# Patient Record
Sex: Female | Born: 1955 | Race: White | Hispanic: No | Marital: Married | Smoking: Former smoker
Health system: Southern US, Community
[De-identification: ages and names within clinical notes are randomized; demographics above are authoritative.]

## PROBLEM LIST (undated history)

## (undated) DIAGNOSIS — G8929 Other chronic pain: Secondary | ICD-10-CM

## (undated) DIAGNOSIS — K219 Gastro-esophageal reflux disease without esophagitis: Secondary | ICD-10-CM

## (undated) DIAGNOSIS — I10 Essential (primary) hypertension: Secondary | ICD-10-CM

## (undated) DIAGNOSIS — F32A Depression, unspecified: Secondary | ICD-10-CM

## (undated) DIAGNOSIS — Z973 Presence of spectacles and contact lenses: Secondary | ICD-10-CM

## (undated) DIAGNOSIS — R32 Unspecified urinary incontinence: Secondary | ICD-10-CM

## (undated) DIAGNOSIS — F411 Generalized anxiety disorder: Secondary | ICD-10-CM

## (undated) DIAGNOSIS — K52831 Collagenous colitis: Secondary | ICD-10-CM

## (undated) DIAGNOSIS — G894 Chronic pain syndrome: Secondary | ICD-10-CM

## (undated) DIAGNOSIS — K580 Irritable bowel syndrome with diarrhea: Secondary | ICD-10-CM

## (undated) DIAGNOSIS — K859 Acute pancreatitis without necrosis or infection, unspecified: Secondary | ICD-10-CM

## (undated) DIAGNOSIS — M545 Low back pain, unspecified: Secondary | ICD-10-CM

## (undated) DIAGNOSIS — G47419 Narcolepsy without cataplexy: Secondary | ICD-10-CM

## (undated) DIAGNOSIS — F4001 Agoraphobia with panic disorder: Secondary | ICD-10-CM

## (undated) DIAGNOSIS — G4730 Sleep apnea, unspecified: Secondary | ICD-10-CM

## (undated) DIAGNOSIS — F3181 Bipolar II disorder: Secondary | ICD-10-CM

## (undated) DIAGNOSIS — M5430 Sciatica, unspecified side: Secondary | ICD-10-CM

## (undated) DIAGNOSIS — F329 Major depressive disorder, single episode, unspecified: Secondary | ICD-10-CM

## (undated) DIAGNOSIS — F5105 Insomnia due to other mental disorder: Secondary | ICD-10-CM

## (undated) DIAGNOSIS — E039 Hypothyroidism, unspecified: Secondary | ICD-10-CM

## (undated) HISTORY — PX: TUBAL LIGATION: SHX77

## (undated) HISTORY — PX: BREAST LUMPECTOMY: SHX2

## (undated) HISTORY — PX: DILATION AND CURETTAGE OF UTERUS: SHX78

---
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
## 1983-12-26 HISTORY — PX: ABDOMINAL HYSTERECTOMY: SHX81

## 1987-12-26 HISTORY — PX: CARPAL TUNNEL RELEASE: SHX101

## 1994-12-25 HISTORY — PX: LAPAROSCOPIC SALPINGOOPHERECTOMY: SUR795

## 1995-12-26 HISTORY — PX: ABDOMINOPLASTY: SUR9

## 1999-12-26 HISTORY — PX: BREAST LUMPECTOMY: SHX2

## 2003-12-26 HISTORY — PX: MASTECTOMY: SHX3

## 2005-12-25 HISTORY — PX: LAPAROSCOPIC CHOLECYSTECTOMY: SUR755

## 2006-06-15 ENCOUNTER — Emergency Department (HOSPITAL_COMMUNITY): Admission: EM | Admit: 2006-06-15 | Discharge: 2006-06-16 | Payer: Self-pay | Admitting: Emergency Medicine

## 2007-12-26 HISTORY — PX: KNEE ARTHROSCOPY: SHX127

## 2008-11-22 ENCOUNTER — Inpatient Hospital Stay (HOSPITAL_COMMUNITY): Admission: AD | Admit: 2008-11-22 | Discharge: 2008-11-24 | Payer: Self-pay | Admitting: Psychiatry

## 2008-11-22 ENCOUNTER — Ambulatory Visit: Payer: Self-pay | Admitting: Psychiatry

## 2008-11-22 ENCOUNTER — Emergency Department (HOSPITAL_COMMUNITY): Admission: EM | Admit: 2008-11-22 | Discharge: 2008-11-22 | Payer: Self-pay | Admitting: Emergency Medicine

## 2009-12-25 HISTORY — PX: BLADDER SUSPENSION: SHX72

## 2009-12-25 HISTORY — PX: VAGINAL PROLAPSE REPAIR: SHX830

## 2010-07-25 HISTORY — PX: VAGINAL PROLAPSE REPAIR: SHX830

## 2010-12-25 HISTORY — PX: ANTERIOR CERVICAL DECOMP/DISCECTOMY FUSION: SHX1161

## 2011-05-09 NOTE — H&P (Signed)
NAME:  Annette Stewart, Annette Stewart            ACCOUNT NO.:  [id number]   MEDICAL RECORD NO.:  [id number]          PATIENT TYPE:  IPS   LOCATION:  0502                          FACILITY:  [facility]   PHYSICIAN:  Annette Stewart, M.D.      DATE OF BIRTH:  [birth year]   DATE OF ADMISSION:  11/22/2008  DATE OF DISCHARGE:                       PSYCHIATRIC ADMISSION ASSESSMENT   IDENTIFICATION:  This is a 55 year old Caucasian female who is married.  This is a voluntary admission   HISTORY OF PRESENT ILLNESS:  First [facility] admission and second psychiatric  admission for this 55 year old who reports that she has had significant  depression recently and attributes this to reason multiple losses and  chronic conflict with her stepdaughter who is living in the home.  She  had previously been taking Wellbutrin for depression, but stopped this  around April or 05-09-23 of this past year.  At that time, she was taking  Norco for abdominal pain due to adhesions, and the Norco made her feel  better.  Most recently she discovered that her abdominal pain was  relieved after surgery but she kept taking the Norco because of the way  it made her feel.  She is here requesting help with depression and  opiate abuse.  Most recently she has taken 14 5 mg tabs of Norco in one  day and took 7 tablets yesterday previous to that she took approximately  100 tablets of 10 mg in 5 days a couple of weeks ago.  She has already  spoken with her prescribing physician who will no longer prescribe this  for her.  She reports that she has been feeling very overwhelmed in the  past couple weeks due to conflict with her stepdaughter who is in the  home.  Additionally, her mother died in 05/08/2004.  Her father died in 09-May-2007  and she had cared for him in the home.  The start of her depression  began on or about 27 when her 16 year old son died after a failed  liver transplant.  She reports that her appetite has been fair.  Her  sleep has been good as usual  as long as she takes her Ativan at 2 mg  q.h.s.  She denies suicidal thought.  She is anxious to pursue treatment  for the depression and is asking for help and to be detoxed off the  opiates.   PAST PSYCHIATRIC HISTORY:  She has been seeing Annette Stewart for counseling  which she started in the past couple of weeks.  This is her second  inpatient psychiatric admission.  She has a history of one prior  admission to [facility] on or about 05/08/1989 after her son  died.  She has had previous trials of Effexor that she reports made her  feel bad.  Cymbalta made her feel bad and gave her suicidal thoughts and  Wellbutrin had helped her the best and she was taking apparently 300 mg  XL q.a.m. starting in December 2008 around the time her father died and  she took it for about 4 months.  She has also had a previous trial  of  Zoloft which she discontinued because of sexual side effects.   SOCIAL HISTORY:  She currently lives at home with her husband who is  supportive.  She reports the marriage is good.  The stepdaughter who is  age 3 has lived in their house since 2004 along with her 59-year-old  grandson.  [name] reports her husband is a very supportive of her and  is making arrangements to her daughter to move out of the house.  She  has two other adult children by previous marriage that are grown and  live on their own.  She has previously married twice and widowed from  her second husband who died after the divorce.  She had a 64 year old  son who died as previously noted, in 52.  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.   FAMILY HISTORY:  Father with history of alcohol abuse.  Brother with a  gambling addiction.   MEDICAL HISTORY:  Primary care practitioner is an [place].   CURRENT MEDICAL PROBLEMS:  None.   PAST MEDICAL HISTORY:  1. Remarkable for abdominal adhesions with previous surgery for lysis      of adhesions.  2. History of mastectomy with a  total breast reconstruction.  3. Hysterectomy in 1985.  4. Some problem with frequent headaches.   CURRENT MEDICATIONS:  1. Ativan 2 mg p.o. q.h.s.  2. Aspirin 81 mg daily.  3. Lunesta 3 mg p.o. q.h.s.  4. Estratest DS one tablet daily.  5. Vicodin or Lortab as previously noted.   ALLERGIES:  PENICILLIN.   PHYSICAL EXAMINATION:  Done in the emergency room as noted in the  record.  This is a medium build Caucasian female in no distress, 5 feet  2 inches tall, 54.1 kg, temperature 97.6, pulse 78, respirations 20,  blood pressure 142/86.   DIAGNOSTIC STUDIES:  Done in the emergency room.  Urine drug screen  positive for benzodiazepines and opiates.  CBC within normal limits.  Chemistry within normal limits.  Acetaminophen level was noted to be  12.3.  TSH and hepatic function is currently pending.   MENTAL STATUS EXAM:  Fully alert female, coherent, oriented x4 in full  contact with reality.  Speech is normal.  Affect blunted but  appropriate.  Grooming and manner are appropriate.  Eye contact is good.  She gives a coherent history.  Mood is depressed.  Thought process  logical and goal-directed.  No suicidal thoughts.  No homicidal  thoughts.  Insight is good.  Discusses stressors in her marriage and  family situation.  No evidence of internal stimulation psychosis or  dangerous thinking.  Cognition is fully intact.   DIAGNOSES:  AXIS I:  Major depressive episode NOS.  Opiate abuse, rule  out dependence.  AXIS II:  Deferred.  AXIS III:.  None.  AXIS IV:  Moderate to severe chronic family discord, having a supportive  husband, stable home and the work that she likes is an asset to her.  AXIS V:  Current 48, past year not known.   PLAN:  Voluntarily admit her to a dual diagnosis program.  She is on a  clonidine detox protocol.  We have discontinued, and she will have  committed to abstinence from the opiates.  We are also going to restart  her Wellbutrin XL 150 mg p.o. q.a.m.,  and she has agreed to have her  husband in for a family session.     Margaret A. Scott, N.P.  Annette Stewart, M.D.  Electronically Signed   MAS/MEDQ  D:  11/23/2008  T:  11/23/2008  Job:  [id number]

## 2011-05-12 NOTE — Discharge Summary (Signed)
NAME:  Annette Stewart, Annette Stewart            ACCOUNT NO.:  [id number]   MEDICAL RECORD NO.:  [id number]          PATIENT TYPE:  IPS   LOCATION:  0502                          FACILITY:  [facility]   PHYSICIAN:  Geoffery Lyons, M.D.      DATE OF BIRTH:  26-Oct-1956   DATE OF ADMISSION:  11/22/2008  DATE OF DISCHARGE:  11/24/2008                               DISCHARGE SUMMARY   CHIEF COMPLAINT/PRESENT ILLNESS:  This was the first admission to [facility] for this 55 year old female, married, voluntarily  admitted.  She reported that she had significant depression previous,  the reason to multiple losses and chronic conflict with her stepdaughter  who is living at the house.  She had previously been taking Wellbutrin  for depression, stopped this around April or 2023/05/15 of last year.  At that  time, she was taking Norco for abdominal pain due to adhesions, and the  Norco made her feel better.  Most recently, her abdominal pain was  relieved after surgery but she kept taking the Norco because of the way  it made her feel.  She was requesting help with depression and opiate  abuse.  She had taken fourteen 5 mg tabs of Norco in 1-day and took 7  the day before.  Previous to that, she took 100 tablets of 10 mg in 5  days a couple of weeks prior to this admission.  She had already spoken  with the prescribing physician who would not longer prescribe this for  her.  She has been feeling very overwhelmed in the last couple of weeks  due to conflict with her stepdaughter who is at home.  Her depression  began around 45, when her 75 year old son died after a failed liver  transplant.  She also wanted to pursue treatment for depression and help  with opiate detox.   PAST PSYCHIATRIC HISTORY:  She has seen a counselor over the last couple  of weeks.  She had one prior admission in [facility] in 1989-05-14, when  her son died.  Effexor made her feel bad, so did Cymbalta and claimed  that this one gave  her suicidal thoughts.  Wellbutrin was the better  one.  She had a prior trial with Zoloft that she discontinued due to  side effects.   ALCOHOL AND DRUG HISTORY:  As already stated, got to abuse opiates.   MEDICAL HISTORY:  1. Abdominal adhesions.  2. Mastectomy with total breast reconstruction.  3. Headaches.   MEDICATIONS:  1. Ativan 2 mg at night.  2. Aspirin 81 mg per day.  3. Lunesta 3 mg at night.  4. Vicodin and Lortab as needed.  5. __________ DS 1 tablet daily.   Physical exam failed to show any acute findings.   LABORATORY WORKUP:  SGOT 13, SGPT 15, total bilirubin 0.7, TSH 0.707.  UDS positive for benzodiazepines and opiates.  CBC within normal limits.  Chemistries within normal limits.   MENTAL STATUS EXAM:  Reveals a fully alert and cooperative female.  Speech was normal rate, tempo and production.  Mood depressed.  Affect  broad.  Thought process logical, coherent and relevant.  No active  suicidal or homicidal ideas, no hallucinations or delusions.  Cognition  well-preserved.   ADMITTING DIAGNOSES:  Axis I:  Opiate abuse.  Major depressive disorder.  Axis II:  No diagnosis.  Axis III:  No diagnosis.  Axis IV:  Moderate.  Axis V:  Global Assessment of Functioning upon admission 35, highest  Global Assessment of Functioning in the last year 75-80.   COURSE IN THE HOSPITAL:  She was admitted.  She was started in  individual and group psychotherapy.  We started detoxing with clonidine.  As already stated, she was able to open up about the loss of a 15-year-  old son who died in 05-07-1989, after a liver transplant.  In May 07, 2004, mother  died, she was taking care of her.  Then in 05-08-2007, father died.  In May 07, 2008, she developed pain due to adhesions.  Lives with her husband and  the stepdaughter who is 56 and her stepdaughter's son who is 5 and he  has been __________ since 2004-05-07.  She feels that the stepdaughter is  rude, inconsiderate and walks all over her.  There was  some  communication with the husband who did say that he asked the  stepdaughter to leave the house.  She agreed to do that.  Just by  knowing that the stepdaughter was in the process of moving alleviated a  lot of the tension.  By November 24, 2009, she was in full contact with  reality.  There were no active suicidal or homicidal ideas.  No  hallucinations or delusions.  Overall, the withdrawal did not go  __________ bad.  She hardly took any of the clonidine.  She was  maintained on her Ativan that was effective to help the anxiety through  the day and insomnia.  Her mood was euthymic and her affect was bright.  We went ahead and discharged to outpatient follow-up.   DISCHARGE DIAGNOSES:  Axis I:  Major depressive disorder, opiate abuse.  Axis II:  No diagnosis.  Axis III:  No diagnosis.  Axis IV:  Moderate.  Axis V:  Global Assessment of Functioning upon discharge 55-60.   Discharged on Ativan 2 mg at bedtime, Lunesta 3 mg at night, Wellbutrin  XL 300 mg in the morning.  Follow up with Toniann Fail in [facility].      Geoffery Lyons, M.D.  Electronically Signed     IL/MEDQ  D:  01/05/2009  T:  01/06/2009  Job:  [id number]

## 2011-09-26 LAB — BASIC METABOLIC PANEL
Calcium: 9.4
Creatinine, Ser: 0.61
Potassium: 4
Sodium: 134 — ABNORMAL LOW

## 2011-09-26 LAB — ETHANOL: Alcohol, Ethyl (B): <5

## 2011-09-26 LAB — RAPID URINE DRUG SCREEN, HOSP PERFORMED
Barbiturates: NOT DETECTED
Cocaine: NOT DETECTED
Opiates: POSITIVE — AB
Tetrahydrocannabinol: NOT DETECTED

## 2011-09-26 LAB — DIFFERENTIAL
Basophils Absolute: 0.1
Basophils Relative: 1
Eosinophils Relative: 1
Lymphocytes Relative: 26
Monocytes Absolute: 0.3
Monocytes Relative: 3
Neutro Abs: 7.6
Neutrophils Relative %: 69

## 2011-09-26 LAB — CBC
HCT: 41.8
MCHC: 34.7
RBC: 4.56
WBC: 11.1 — ABNORMAL HIGH

## 2011-09-26 LAB — ACETAMINOPHEN LEVEL: Acetaminophen (Tylenol), Serum: 12.3

## 2011-12-26 HISTORY — PX: TRIGGER FINGER RELEASE: SHX641

## 2011-12-26 HISTORY — PX: BLADDER SURGERY: SHX569

## 2014-11-24 HISTORY — PX: BLADDER SURGERY: SHX569

## 2014-12-25 HISTORY — PX: TOE SURGERY: SHX1073

## 2015-02-23 DIAGNOSIS — Z8719 Personal history of other diseases of the digestive system: Secondary | ICD-10-CM

## 2015-02-23 HISTORY — DX: Personal history of other diseases of the digestive system: Z87.19

## 2015-03-09 ENCOUNTER — Inpatient Hospital Stay (HOSPITAL_COMMUNITY)
Admission: EM | Admit: 2015-03-09 | Discharge: 2015-03-11 | DRG: 440 | Disposition: A | Discharge status: Home / Self Care | Payer: Managed Care, Other (non HMO) | Attending: Oncology | Admitting: Oncology

## 2015-03-09 ENCOUNTER — Encounter (HOSPITAL_COMMUNITY): Payer: Self-pay | Admitting: *Deleted

## 2015-03-09 ENCOUNTER — Emergency Department (HOSPITAL_COMMUNITY): Payer: Managed Care, Other (non HMO)

## 2015-03-09 ENCOUNTER — Other Ambulatory Visit (HOSPITAL_COMMUNITY): Payer: Self-pay

## 2015-03-09 DIAGNOSIS — E781 Pure hyperglyceridemia: Secondary | ICD-10-CM | POA: Diagnosis present

## 2015-03-09 DIAGNOSIS — R1013 Epigastric pain: Secondary | ICD-10-CM | POA: Diagnosis not present

## 2015-03-09 DIAGNOSIS — M545 Low back pain: Secondary | ICD-10-CM | POA: Diagnosis present

## 2015-03-09 DIAGNOSIS — M5136 Other intervertebral disc degeneration, lumbar region: Secondary | ICD-10-CM

## 2015-03-09 DIAGNOSIS — F17210 Nicotine dependence, cigarettes, uncomplicated: Secondary | ICD-10-CM | POA: Diagnosis present

## 2015-03-09 DIAGNOSIS — K859 Acute pancreatitis without necrosis or infection, unspecified: Secondary | ICD-10-CM | POA: Diagnosis present

## 2015-03-09 DIAGNOSIS — Z7982 Long term (current) use of aspirin: Secondary | ICD-10-CM

## 2015-03-09 DIAGNOSIS — R739 Hyperglycemia, unspecified: Secondary | ICD-10-CM | POA: Diagnosis present

## 2015-03-09 DIAGNOSIS — Z79891 Long term (current) use of opiate analgesic: Secondary | ICD-10-CM

## 2015-03-09 DIAGNOSIS — Z79899 Other long term (current) drug therapy: Secondary | ICD-10-CM

## 2015-03-09 DIAGNOSIS — M549 Dorsalgia, unspecified: Secondary | ICD-10-CM

## 2015-03-09 DIAGNOSIS — Z23 Encounter for immunization: Secondary | ICD-10-CM

## 2015-03-09 DIAGNOSIS — E785 Hyperlipidemia, unspecified: Secondary | ICD-10-CM | POA: Diagnosis present

## 2015-03-09 DIAGNOSIS — K760 Fatty (change of) liver, not elsewhere classified: Secondary | ICD-10-CM | POA: Diagnosis present

## 2015-03-09 DIAGNOSIS — G8929 Other chronic pain: Secondary | ICD-10-CM | POA: Diagnosis present

## 2015-03-09 DIAGNOSIS — R079 Chest pain, unspecified: Secondary | ICD-10-CM | POA: Diagnosis not present

## 2015-03-09 HISTORY — DX: Major depressive disorder, single episode, unspecified: F32.9

## 2015-03-09 HISTORY — DX: Acute pancreatitis without necrosis or infection, unspecified: K85.90

## 2015-03-09 HISTORY — DX: Sciatica, unspecified side: M54.30

## 2015-03-09 HISTORY — DX: Depression, unspecified: F32.A

## 2015-03-09 HISTORY — DX: Gastro-esophageal reflux disease without esophagitis: K21.9

## 2015-03-09 HISTORY — DX: Low back pain, unspecified: M54.50

## 2015-03-09 HISTORY — DX: Low back pain: M54.5

## 2015-03-09 HISTORY — DX: Other chronic pain: G89.29

## 2015-03-09 HISTORY — DX: Sleep apnea, unspecified: G47.30

## 2015-03-09 LAB — BASIC METABOLIC PANEL
ANION GAP: 10 (ref 5–15)
BUN: 11 mg/dL (ref 6–23)
CHLORIDE: 99 mmol/L (ref 96–112)
CO2: 28 mmol/L (ref 19–32)
Calcium: 9.4 mg/dL (ref 8.4–10.5)
Creatinine, Ser: 0.75 mg/dL (ref 0.50–1.10)
GFR calc Af Amer: >90 mL/min (ref >90)
GFR calc non Af Amer: >90 mL/min (ref >90)
Glucose, Bld: 219 mg/dL — ABNORMAL HIGH (ref 70–99)
POTASSIUM: 3.7 mmol/L (ref 3.5–5.1)
SODIUM: 137 mmol/L (ref 135–145)

## 2015-03-09 LAB — HEPATIC FUNCTION PANEL
ALT: 33 U/L (ref 0–35)
AST: 22 U/L (ref 0–37)
Albumin: 3.8 g/dL (ref 3.5–5.2)
Alkaline Phosphatase: 73 U/L (ref 39–117)
BILIRUBIN TOTAL: 1.1 mg/dL (ref 0.3–1.2)
Total Protein: 6.5 g/dL (ref 6.0–8.3)

## 2015-03-09 LAB — BRAIN NATRIURETIC PEPTIDE: B NATRIURETIC PEPTIDE 5: 18 pg/mL (ref 0.0–100.0)

## 2015-03-09 LAB — LIPID PANEL
Cholesterol: 224 mg/dL — ABNORMAL HIGH (ref 0–200)
HDL: 37 mg/dL — ABNORMAL LOW (ref >39)
LDL CALC: UNDETERMINED mg/dL (ref 0–99)
TRIGLYCERIDES: 502 mg/dL — AB (ref <150)
Total CHOL/HDL Ratio: 6.1 RATIO
VLDL: UNDETERMINED mg/dL (ref 0–40)

## 2015-03-09 LAB — I-STAT TROPONIN, ED: Troponin i, poc: 0 ng/mL (ref 0.00–0.08)

## 2015-03-09 LAB — CBC
HCT: 40.6 % (ref 36.0–46.0)
HEMOGLOBIN: 14.1 g/dL (ref 12.0–15.0)
MCH: 31.2 pg (ref 26.0–34.0)
MCHC: 34.7 g/dL (ref 30.0–36.0)
MCV: 89.8 fL (ref 78.0–100.0)
PLATELETS: 252 10*3/uL (ref 150–400)
RBC: 4.52 MIL/uL (ref 3.87–5.11)
RDW: 13 % (ref 11.5–15.5)
WBC: 12.3 10*3/uL — AB (ref 4.0–10.5)

## 2015-03-09 LAB — TROPONIN I

## 2015-03-09 LAB — D-DIMER, QUANTITATIVE (NOT AT ARMC): D DIMER QUANT: 1.22 ug{FEU}/mL — AB (ref 0.00–0.48)

## 2015-03-09 LAB — LIPASE, BLOOD: LIPASE: 780 U/L — AB (ref 11–59)

## 2015-03-09 MED ORDER — MORPHINE SULFATE 4 MG/ML IJ SOLN
4.0000 mg | INTRAMUSCULAR | Status: DC | PRN
  Administered 2015-03-10: 4 mg via INTRAVENOUS
  Filled 2015-03-09: qty 1

## 2015-03-09 MED ORDER — ONDANSETRON HCL 4 MG/2ML IJ SOLN
4.0000 mg | Freq: Once | INTRAMUSCULAR | Status: AC
  Administered 2015-03-09: 4 mg via INTRAVENOUS
  Filled 2015-03-09: qty 2

## 2015-03-09 MED ORDER — PNEUMOCOCCAL VAC POLYVALENT 25 MCG/0.5ML IJ INJ
0.5000 mL | INJECTION | INTRAMUSCULAR | Status: AC
  Administered 2015-03-10: 0.5 mL via INTRAMUSCULAR
  Filled 2015-03-09: qty 0.5

## 2015-03-09 MED ORDER — OXYCODONE HCL 5 MG PO TABS
30.0000 mg | ORAL_TABLET | ORAL | Status: DC | PRN
  Administered 2015-03-09 – 2015-03-11 (×7): 30 mg via ORAL
  Filled 2015-03-09 (×7): qty 6

## 2015-03-09 MED ORDER — METHADONE HCL 10 MG PO TABS
10.0000 mg | ORAL_TABLET | Freq: Four times a day (QID) | ORAL | Status: DC
  Administered 2015-03-09 – 2015-03-11 (×7): 10 mg via ORAL
  Filled 2015-03-09 (×7): qty 1

## 2015-03-09 MED ORDER — ONDANSETRON HCL 4 MG/2ML IJ SOLN: 4.0000 mg | Freq: Four times a day (QID) | INTRAMUSCULAR | Status: DC | PRN

## 2015-03-09 MED ORDER — TRAZODONE HCL 50 MG PO TABS
50.0000 mg | ORAL_TABLET | Freq: Every day | ORAL | Status: DC
  Administered 2015-03-10 (×2): 50 mg via ORAL
  Filled 2015-03-09 (×4): qty 1

## 2015-03-09 MED ORDER — SODIUM CHLORIDE 0.9 % IV BOLUS (SEPSIS)
1000.0000 mL | Freq: Once | INTRAVENOUS | Status: AC
  Administered 2015-03-09: 1000 mL via INTRAVENOUS

## 2015-03-09 MED ORDER — SODIUM CHLORIDE 0.9 % IV SOLN
INTRAVENOUS | Status: AC
  Administered 2015-03-10 (×2): via INTRAVENOUS

## 2015-03-09 MED ORDER — MORPHINE SULFATE 4 MG/ML IJ SOLN
4.0000 mg | Freq: Once | INTRAMUSCULAR | Status: AC
  Administered 2015-03-09: 4 mg via INTRAVENOUS
  Filled 2015-03-09: qty 1

## 2015-03-09 MED ORDER — ENOXAPARIN SODIUM 40 MG/0.4ML ~~LOC~~ SOLN
40.0000 mg | SUBCUTANEOUS | Status: DC
  Administered 2015-03-09 – 2015-03-10 (×2): 40 mg via SUBCUTANEOUS
  Filled 2015-03-09 (×3): qty 0.4

## 2015-03-09 MED ORDER — ASPIRIN EC 81 MG PO TBEC
81.0000 mg | DELAYED_RELEASE_TABLET | Freq: Every day | ORAL | Status: DC
  Administered 2015-03-09 – 2015-03-11 (×3): 81 mg via ORAL
  Filled 2015-03-09 (×3): qty 1

## 2015-03-09 MED ORDER — DIPHENHYDRAMINE HCL 50 MG/ML IJ SOLN
25.0000 mg | Freq: Once | INTRAMUSCULAR | Status: AC
  Administered 2015-03-09: 25 mg via INTRAVENOUS
  Filled 2015-03-09: qty 1

## 2015-03-09 MED ORDER — MORPHINE SULFATE 4 MG/ML IJ SOLN
4.0000 mg | Freq: Once | INTRAMUSCULAR | Status: AC
Start: 2015-03-09 — End: 2015-03-09
  Administered 2015-03-09: 4 mg via INTRAVENOUS
  Filled 2015-03-09: qty 1

## 2015-03-09 NOTE — ED Notes (Signed)
Pt states that she began having central chest pain that started last night with radiation to back. Pt states that she continued to have pain this morning when woke. Pain lasts approx 2-3 minutes. Pt states that it is worse with breathing or palpation.

## 2015-03-09 NOTE — ED Notes (Signed)
Lab called for update on lipase results

## 2015-03-09 NOTE — H&P (Signed)
Date: 03/09/2015               Patient Name:  Annette Stewart MRN: [id number]  DOB: 12-02-1956 Age / Sex: 59 y.o., female   PCP: No primary care provider on file.              Medical Service: Internal Medicine Teaching Service              Attending Physician: Dr. Levert FeinsteinJames M Granfortuna, MD    First Contact: Dr. Leatha GildingMallory  Pager: [phone]  Second Contact: Dr. Mariea ClontsEmokpae Pager: [phone]            After Hours (After 5p/  First Contact Pager: [phone]  weekends / holidays): Second Contact Pager: [phone]   Chief Complaint: Epigastric/chest pain  History of Present Illness: This is a 59 year old woman with past medical history of chronic low back pain who presents to the emergency department with epigastric pain that radiates to the back. Patient describes her pain as dull, right in the middle of her chest going to the back. She describes some nausea associated with the pain but without vomiting. Her bowel movements have been normal with the last one being 2 this morning. She states that her pain is an 8/10, non-positional and nonexertional. She states that pain somehow is worse when he takes a deep breath during its peak. The pain increased when she attempted to eat a [place] toast this morning. Otherwise, normally she has no pain with food. It was happening intermittently every a few minutes, but has since improved after arrival to the ED with pain medications. She denies shortness of breath or palpitations, DOE, or PND. No leg swelling. No dizziness. No lightheadedness. She denies any urinary symptoms. No constitutional symptoms like chills, fevers, or increased fatigue. This is the index episode. She has a history of cholecystectomy. She does not consume alcohol. No recent surgery or long trips.  Review of Systems:  HEENT: No URI symptoms. No neck pain or photophobia Respiratory: No cough, chest tightness, and wheezing. No hemoptysis.  Musculoskeletal: Chronic low back pain due to  degenerative disc disease. No myalgias, joint swelling, arthralgias and gait problem.  Skin: No rash and wound. No easy bruising. Neurological: No seizures, syncope, weakness, numbness and headaches.  Psychiatric/Behavioral: No SI, mood changes, confusion, nervousness, sleep disturbance and agitation  Meds: No current facility-administered medications for this encounter.   Current Outpatient Prescriptions  Medication Sig Dispense Refill  . aspirin 81 MG tablet Take 81 mg by mouth every morning.    . conjugated estrogens (PREMARIN) vaginal cream Place 1 g vaginally daily.     . methadone (DOLOPHINE) 10 MG tablet Take 10 mg by mouth 4 (four) times daily.    . Multiple Vitamins-Minerals (CENTRUM ADULTS PO) Take 1 tablet by mouth every morning.    Marland Kitchen. oxycodone (ROXICODONE) 30 MG immediate release tablet Take 30 mg by mouth every 4 (four) hours as needed for pain.    . traZODone (DESYREL) 50 MG tablet Take 50 mg by mouth at bedtime.      Allergies: Allergies as of 03/09/2015 - Review Complete 03/09/2015  Allergen Reaction Noted  . Duloxetine hcl Other (See Comments) 03/09/2015  . Oscal 500-200 d-3 [calcium-vitamin d] Nausea And Vomiting 03/09/2015  . Penicillins Rash 03/09/2015   History reviewed. No pertinent past medical history. Past Surgical History  Procedure Laterality Date  . Bladder surgery    . Abdominal hysterectomy    . Breast surgery    . Cholecystectomy  Unsure date   No family history on file. History   Social History  . Marital Status: Married    Spouse Name: N/A  . Number of Children: N/A  . Years of Education: N/A   Occupational History  . Not on file.   Social History Main Topics  . Smoking status: Current Every Day Smoker  . Smokeless tobacco: Not on file  . Alcohol Use: Not on file  . Drug Use: Not on file  . Sexual Activity: Not on file   Other Topics Concern  . Not on file   Social History Narrative  . No narrative on file       Physical Exam: Blood pressure 118/55, pulse 54, temperature 98.7 F (37.1 C), temperature source Oral, resp. rate 16, height  (1.549 m), weight 136 lb (61.689 kg), SpO2 92 %.  General: well developed, well nourished; no acute distress, cooperative with exam HEENT: Neck is supple. Head is Atraumatic. Normal EOM. Pupils equal, round and reactive; anicteric. Nose/throat: oropharynx clear, moist mucous membranes, pink gums  Lungs/Chest wall: mid-chest wall tenderness. CTA bil, normal work of breathing  Heart: normal RRR; no murmurs. No JVD Pulses: radial and dorsalis pedis pulses are 2+ and symmetric  Abdomen: Epigastric tenderness. Normal fullness, no rebound, guarding, or rigidity; nl BS; no palpable masses.  Skin: warm, dry, intact, normal turgor, no rashes  Extremities: no peripheral edema, clubbing, or cyanosis Neurologic: A&O X3, CN II - XII are grossly intact. Strength 5/5 in the all 4 extremities, Sensation grossly intact. Psych: Normal mood and affect. Normal speech and behavior. No agitation   Lab results: Basic Metabolic Panel:  Recent Labs  16/10/96 1145  NA 137  K 3.7  CL 99  CO2 28  GLUCOSE 219*  BUN 11  CREATININE 0.75  CALCIUM 9.4   Liver Function Tests: No results for input(s): AST, ALT, ALKPHOS, BILITOT, PROT, ALBUMIN in the last 72 hours.  Recent Labs  03/09/15 1429  LIPASE 780*   No results for input(s): AMMONIA in the last 72 hours. CBC:  Recent Labs  03/09/15 1145  WBC 12.3*  HGB 14.1  HCT 40.6  MCV 89.8  PLT 252   D-Dimer:  Recent Labs  03/09/15 1458  DDIMER 1.22*     Imaging results:  Dg Chest 2 View  03/09/2015   CLINICAL DATA:  Central chest pain started last night. Pain radiates to the back. Pain this morning when she awoke. Pain lasts 2-3 minutes. Pain worse with with breathing, palpation. History of smoking.  EXAM: CHEST  2 VIEW  COMPARISON:  None.  FINDINGS: Heart size is normal. There is mild perihilar  peribronchial thickening. No focal consolidations or pleural effusions are identified. No pulmonary edema. Visualized osseous structures have a normal appearance. Previous anterior cervical fusion. Surgical clips are noted in the right upper quadrant of the abdomen.  IMPRESSION: 1. Mild bronchitic changes. 2.  No focal acute pulmonary abnormality.   Electronically Signed   By: Norva Pavlov M.D.   On: 03/09/2015 12:54    Other results: EKG: normal EKG, normal sinus rhythm, nonspecific ST and T waves changes.  Assessment & Plan by Problem: Principal Problem:   Pancreatitis, acute Active Problems:   Chronic back pain   Acute pancreatitis-mild: Presentation concerning for acute pancreatitis. However, physical examination findings with the chest wall tenderness is not typical for acute pancreatitis which raises the possibility of musculoskeletal pain with costochondritis as a differential. Lipase was elevated at 780. Of note,  patient does not take alcohol, which is one of the commonest cause of acute pancreatitis along with gallstones. She also has a history of cholecystectomy, which excludes acute cholecystitis. It is conceivable that she might have a nonobstructing gallstone in the CBD. She received 1 L of IV normal saline. Plan -Admit to telemetry bed and observation. -Add Liver panel on BMP. Check Llipid panel  -NPO. May start clears tomorrow morning if her condition remains stable. -IVF normal saline 200 cc/h overnight -Continue with her home pain regimen (methadone 10 mg 4 times a day plus oxycodone 30 mg 3 times a day) -Add IV morphine 4 mg every 3 hours prn  Chest pain: Likely related to problem #1 above. However, costochondritis, as already mentioned is a differential. Patient has no history of CAD but her risk factors include smoking. Two-view chest x-ray revealed mild bronchitic changes, which I believe are related to her chronic cigarette smoking. No pneumonia. EKG nonspecific T-wave  changes. No prior EKGs to compare. Initial troponins - negative. Pretest clinical probability for PE is low using the Wells score (0 points), however d-dimer obtained in ED was elevated. I will not pursue CTA due to low probability as d-dimer is likely false-positive. Plan -Cycle troponin. -Repeat EKG in the a.m. -May consider nitroglycerin paste if needed -Continue with aspirin  Chronic low back pain: Patient states that she has degenerative disc disease in her lower back. Will continue with her home regimen as above in problem #1.  Hyperglycemia: Patient has no history of diabetes, however, presenting CBG of 219.   Code Status: Full code   F/E/N: NPO   VTE Ppx: Lovenox subQ  Family Communication: Discussed with the patient about plan of care. All questions answered.  Dispo: Disposition is deferred at this time, awaiting improvement of current medical problems. Anticipated discharge in approximately 1 day. She can be discharged tomorrow if she tolerates by mouth intake.   The patient does not have a current PCP (No primary care provider on file.), therefore will be require OPC follow-up after discharge.   The patient does not have transportation limitations that hinder transportation to clinic appointments.   Signed:  Dow Adolph, MD PGY-3 Internal Medicine Teaching Service Pager [phone]  03/09/2015, 5:59 PM

## 2015-03-09 NOTE — ED Provider Notes (Signed)
CSN: [id number]     Arrival date & time 03/09/15  1126 History   First MD Initiated Contact with Patient 03/09/15 1416     Chief Complaint  Patient presents with  . Chest Pain    Patient is a 59 y.o. female presenting with chest pain. The history is provided by the patient. No language interpreter was used.  Chest Pain  [name] presents for evaluation of chest pain.  She developed central chest pain last night that is waxing and waning in nature and sore to palpation and radiates to her back.  Pain is sharp and dull in nature.  Pain episodes last about a minute, resolve for a few minutes and then return.  She denies fevers, cough, abdominal pain, vomiting, diarrhea, leg swelling/pain.  She does have associated SOB and nausea.  Sxs are moderate, waxing and waning, worsening.    History reviewed. No pertinent past medical history. Past Surgical History  Procedure Laterality Date  . Bladder surgery    . Abdominal hysterectomy    . Breast surgery     No family history on file. History  Substance Use Topics  . Smoking status: Current Every Day Smoker  . Smokeless tobacco: Not on file  . Alcohol Use: Not on file   OB History    No data available     Review of Systems  Cardiovascular: Positive for chest pain.  All other systems reviewed and are negative.     Allergies  Penicillins  Home Medications   Prior to Admission medications   Not on File   BP 164/71 mmHg  Pulse 76  Temp(Src) 98.7 F (37.1 C) (Oral)  Resp 18  Ht  (1.549 m)  Wt 136 lb (61.689 kg)  BMI 25.71 kg/m2  SpO2 96% Physical Exam  Constitutional: She is oriented to person, place, and time. She appears well-developed and well-nourished.  HENT:  Head: Normocephalic and atraumatic.  Cardiovascular: Normal rate and regular rhythm.   No murmur heard. Pulmonary/Chest: Effort normal and breath sounds normal. No respiratory distress.  Abdominal: Soft. There is no rebound and no guarding.  Moderate  epigastric tenderness  Musculoskeletal: She exhibits no edema or tenderness.  Neurological: She is alert and oriented to person, place, and time.  Skin: Skin is warm and dry.  Psychiatric: She has a normal mood and affect. Her behavior is normal.  Nursing note and vitals reviewed.   ED Course  Procedures (including critical care time) Labs Review Labs Reviewed  CBC - Abnormal; Notable for the following:    WBC 12.3 (*)    All other components within normal limits  BASIC METABOLIC PANEL - Abnormal; Notable for the following:    Glucose, Bld 219 (*)    All other components within normal limits  D-DIMER, QUANTITATIVE - Abnormal; Notable for the following:    D-Dimer, Quant 1.22 (*)    All other components within normal limits  LIPASE, BLOOD - Abnormal; Notable for the following:    Lipase 780 (*)    All other components within normal limits  BRAIN NATRIURETIC PEPTIDE  I-STAT TROPOININ, ED    Imaging Review Dg Chest 2 View  03/09/2015   CLINICAL DATA:  Central chest pain started last night. Pain radiates to the back. Pain this morning when she awoke. Pain lasts 2-3 minutes. Pain worse with with breathing, palpation. History of smoking.  EXAM: CHEST  2 VIEW  COMPARISON:  None.  FINDINGS: Heart size is normal. There is mild perihilar  peribronchial thickening. No focal consolidations or pleural effusions are identified. No pulmonary edema. Visualized osseous structures have a normal appearance. Previous anterior cervical fusion. Surgical clips are noted in the right upper quadrant of the abdomen.  IMPRESSION: 1. Mild bronchitic changes. 2.  No focal acute pulmonary abnormality.   Electronically Signed   By: Norva PavlovElizabeth  Brown M.D.   On: 03/09/2015 12:54     EKG Interpretation   Date/Time:  Tuesday March 09 2015 11:36:12 EDT Ventricular Rate:  75 PR Interval:  116 QRS Duration: 72 QT Interval:  334 QTC Calculation: 372 R Axis:   76 Text Interpretation:  Normal sinus rhythm  Nonspecific ST and T wave  abnormality Abnormal ECG Confirmed by Lincoln Brighamees, Liz [phone]([id number]) on 03/09/2015  2:17:09 PM      MDM   Final diagnoses:  Acute pancreatitis, unspecified pancreatitis type   Patient here for evaluation of chest/ epigastric pain.   History of the local oratory analysis is consistent with acute pancreatitis. Patient without history of similar symptoms previously. Initially d-dimer was sent given patient's history of topical estrogen use and smoking. D-dimer is elevated, but this is most likely related to the acute pancreatitis, do not feel that additional workup for PE is warranted at this time given clear cor cause of her pain and shortness of breath. Discussed with internal medicine resident service regarding admission for further workup and management.  Tilden FossaElizabeth Keilee Denman, MD 03/09/15 [phone]

## 2015-03-09 NOTE — ED Notes (Signed)
Admitting MD's in w/pt.  

## 2015-03-09 NOTE — Progress Notes (Signed)
Received report from the ED. Nelda MarseilleJenny Thacker, RN (charge nurse)

## 2015-03-10 ENCOUNTER — Observation Stay (HOSPITAL_COMMUNITY): Payer: Managed Care, Other (non HMO)

## 2015-03-10 DIAGNOSIS — E785 Hyperlipidemia, unspecified: Secondary | ICD-10-CM

## 2015-03-10 DIAGNOSIS — G8929 Other chronic pain: Secondary | ICD-10-CM | POA: Diagnosis present

## 2015-03-10 DIAGNOSIS — E781 Pure hyperglyceridemia: Secondary | ICD-10-CM | POA: Diagnosis present

## 2015-03-10 DIAGNOSIS — Z79899 Other long term (current) drug therapy: Secondary | ICD-10-CM | POA: Diagnosis not present

## 2015-03-10 DIAGNOSIS — R739 Hyperglycemia, unspecified: Secondary | ICD-10-CM

## 2015-03-10 DIAGNOSIS — R1013 Epigastric pain: Secondary | ICD-10-CM | POA: Diagnosis present

## 2015-03-10 DIAGNOSIS — Z23 Encounter for immunization: Secondary | ICD-10-CM | POA: Diagnosis not present

## 2015-03-10 DIAGNOSIS — F17210 Nicotine dependence, cigarettes, uncomplicated: Secondary | ICD-10-CM | POA: Diagnosis present

## 2015-03-10 DIAGNOSIS — K859 Acute pancreatitis without necrosis or infection, unspecified: Secondary | ICD-10-CM | POA: Diagnosis present

## 2015-03-10 DIAGNOSIS — M545 Low back pain: Secondary | ICD-10-CM | POA: Diagnosis present

## 2015-03-10 DIAGNOSIS — K760 Fatty (change of) liver, not elsewhere classified: Secondary | ICD-10-CM | POA: Diagnosis present

## 2015-03-10 DIAGNOSIS — Z79891 Long term (current) use of opiate analgesic: Secondary | ICD-10-CM | POA: Diagnosis not present

## 2015-03-10 DIAGNOSIS — M549 Dorsalgia, unspecified: Secondary | ICD-10-CM

## 2015-03-10 DIAGNOSIS — K858 Other acute pancreatitis: Secondary | ICD-10-CM

## 2015-03-10 DIAGNOSIS — Z9049 Acquired absence of other specified parts of digestive tract: Secondary | ICD-10-CM

## 2015-03-10 DIAGNOSIS — Z7982 Long term (current) use of aspirin: Secondary | ICD-10-CM | POA: Diagnosis not present

## 2015-03-10 LAB — TROPONIN I: Troponin I: <0.03 ng/mL (ref <0.031)

## 2015-03-10 MED ORDER — OMEGA-3-ACID ETHYL ESTERS 1 G PO CAPS
1.0000 g | ORAL_CAPSULE | Freq: Two times a day (BID) | ORAL | Status: DC
  Administered 2015-03-10 – 2015-03-11 (×3): 1 g via ORAL
  Filled 2015-03-10 (×4): qty 1

## 2015-03-10 MED ORDER — IOHEXOL 350 MG/ML SOLN
100.0000 mL | Freq: Once | INTRAVENOUS | Status: AC | PRN
  Administered 2015-03-10: 100 mL via INTRAVENOUS

## 2015-03-10 MED ORDER — MORPHINE SULFATE 4 MG/ML IJ SOLN
4.0000 mg | INTRAMUSCULAR | Status: DC | PRN
  Administered 2015-03-10: 4 mg via INTRAVENOUS
  Filled 2015-03-10: qty 1

## 2015-03-10 NOTE — Progress Notes (Signed)
UR completed 

## 2015-03-10 NOTE — Progress Notes (Signed)
Subjective: [name] feels well this morning. Her pain is at a 1/10 in intensity and is located in the same epigastric location. She would like to try to advance her diet today.  Objective: Vital signs in last 24 hours: Filed Vitals:   03/09/15 2000 03/09/15 2026 03/09/15 2032 03/10/15 0640  BP: 104/59  117/68 107/50  Pulse: 51  56 58  Temp:   98.6 F (37 C) 98.2 F (36.8 C)  TempSrc:   Oral Oral  Resp: Height:  5' 1.5" (1.562 m)    Weight:  137 lb 5.6 oz (62.3 kg)    SpO2: 92%  94% 92%   Weight change:   Intake/Output Summary (Last 24 hours) at 03/10/15 1027 Last data filed at 03/10/15 0833  Gross per 24 hour  Intake      0 ml  Output   1950 ml  Net  -1950 ml   Physical Exam: Appearance: in NAD, sleeping in bed with daughter at bedside HEENT: AT/[place], PERRL, EOMi Heart: RRR, normal S1S2, no murmurs Lungs: CTAB, no wheezing Abdomen: BS present, soft, tender only in epigastric area, no rebound Musculoskeletal: normal range of motion with no edema Neurologic: A&Ox3, grossly normal Skin: normal color, normal turgor, no rashes or lesions  Lab Results: Basic Metabolic Panel:  Recent Labs Lab 03/09/15 1145  NA 137  K 3.7  CL 99  CO2 28  GLUCOSE 219*  BUN 11  CREATININE 0.75  CALCIUM 9.4   Liver Function Tests:  Recent Labs Lab 03/09/15 2139  AST 22  ALT 33  ALKPHOS 73  BILITOT 1.1  PROT 6.5  ALBUMIN 3.8    Recent Labs Lab 03/09/15 1429  LIPASE 780*   CBC:  Recent Labs Lab 03/09/15 1145  WBC 12.3*  HGB 14.1  HCT 40.6  MCV 89.8  PLT 252    Cardiac Enzymes:  Recent Labs Lab 03/09/15 2139 03/10/15 0050 03/10/15 0638  TROPONINI <0.03 <0.03 <0.03   D-Dimer:  Recent Labs Lab 03/09/15 1458  DDIMER 1.22*   Fasting Lipid Panel:  Recent Labs Lab 03/09/15 2139  CHOL 224*  HDL 37*  LDLCALC UNABLE TO CALCULATE IF TRIGLYCERIDE OVER 400 mg/dL  TRIG 161*  CHOLHDL 6.1   Urine Drug Screen: Drugs of Abuse       Component Value Date/Time   LABOPIA POSITIVE* 11/22/2008 1415   COCAINSCRNUR NONE DETECTED 11/22/2008 1415   LABBENZ POSITIVE* 11/22/2008 1415   AMPHETMU NONE DETECTED 11/22/2008 1415   THCU NONE DETECTED 11/22/2008 1415   LABBARB  11/22/2008 1415    NONE DETECTED        DRUG SCREEN FOR MEDICAL PURPOSES ONLY.  IF CONFIRMATION IS NEEDED FOR ANY PURPOSE, NOTIFY LAB WITHIN 5 DAYS.    Micro Results: No results found for this or any previous visit (from the past 240 hour(s)). Studies/Results: Dg Chest 2 View  03/09/2015   CLINICAL DATA:  Central chest pain started last night. Pain radiates to the back. Pain this morning when she awoke. Pain lasts 2-3 minutes. Pain worse with with breathing, palpation. History of smoking.  EXAM: CHEST  2 VIEW  COMPARISON:  None.  FINDINGS: Heart size is normal. There is mild perihilar peribronchial thickening. No focal consolidations or pleural effusions are identified. No pulmonary edema. Visualized osseous structures have a normal appearance. Previous anterior cervical fusion. Surgical clips are noted in the right upper quadrant of the abdomen.  IMPRESSION: 1. Mild bronchitic changes. 2.  No focal  acute pulmonary abnormality.   Electronically Signed   By: Annette PavlovElizabeth  Stewart M.D.   On: 03/09/2015 12:54   CT pending  Medications: I have reviewed the patient's current medications. Scheduled Meds: . aspirin EC  81 mg Oral Daily  . enoxaparin (LOVENOX) injection  40 mg Subcutaneous Q24H  . methadone  10 mg Oral QID  . pneumococcal 23 valent vaccine  0.5 mL Intramuscular Tomorrow-1000  . traZODone  50 mg Oral QHS   Continuous Infusions:  PRN Meds:.morphine injection, ondansetron (ZOFRAN) IV, oxycodone Assessment/Plan: Principal Problem:   Pancreatitis, acute Active Problems:   Chronic back pain   Hyperlipidemia  [name] is a [age] woman who was admitted for pancreatitis. She had a lipase of 780 and a triglyeride level of 502. She is not an alcoholic and  is s/p cholecystectomy. Her symptoms are mild and she would like to advance her diet.  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 She does not have a history of alcohol abuse and does not currently drink any alcohol. She is status post cholecystectomy and her bilirubin levels are normal. It is most likely that her acute pancreatitis is secondary to her hypertriglyceridemia, but we will proceed with imaging to better visualize her pancreas to rule out a retained stone that could be contributing. - CT with and without contrast (pancreatic protocol)  - Advance diet as tolerated, clear liquids - Pain has been well controlled on home methadone and oxycodone along with morphine for breakthrough pain (q4 hours PRN) - IVF were given for 15 hours at 200 mL/hr; expired at 9:00am - Zofran 4 mg q6 hours PRN; patient has not required this - Will need treatment for her hyperlipidemia (see below)  - D/c telemetry  Hyperlipidemia Patient has elevated cholesterol, low HDL and elevated triglycerides. She will need to initiate treatment. If she had cardiovascular risk factors, she should be started on a statin. Given that she currently does not, consider omega 3 or gemfibrozil along with fat restricted diet. Patient prefers omega 3.  - Diet counseling - Start omega 3 (patient's preference)  Chronic Back Pain: Patient is on a heavy home pain regimen that involves methadone 10 mg QID and oxycodone 30 mg TID PRN. - Continue home pain regimen  Hyperglycemia: Glucose 219 on admission. No known history of DM, but mother had DMII.  - Hemoglobin A1c pending  Diet: Clear liquids; advance as tolerated  DVT Ppx: sq lovenox  Dispo: Disposition is deferred at this time, awaiting improvement of current medical problems.  Anticipated discharge in approximately 0-1 day(s).   The patient does have a current PCP Annette Farmer(Lewis Lipscomb,  MD) and [facility] after [facility].  The patient does not have transportation limitations that hinder transportation to clinic appointments.  .Services Needed at time of discharge: Y = Yes, Blank = No PT:   OT:   RN:   Equipment:   Other:     LOS: 1 day   Stark BrayJulia E Saryiah Bencosme, MD 03/10/2015, 10:27 AM

## 2015-03-10 NOTE — Progress Notes (Signed)
Noted that patient had a blood sugar of 219 mg/dl on 1/613/15.  Recommend checking HgbA1C for home blood glucose control.  Also recommend checking CBGs TID & HS while in the hospital.  Will continue to follow while in hospital. Smith MinceKendra Kahlin Mark RN BSN CDE

## 2015-03-11 DIAGNOSIS — E781 Pure hyperglyceridemia: Secondary | ICD-10-CM

## 2015-03-11 DIAGNOSIS — R7309 Other abnormal glucose: Secondary | ICD-10-CM

## 2015-03-11 LAB — GLUCOSE, CAPILLARY: Glucose-Capillary: 82 mg/dL (ref 70–99)

## 2015-03-11 LAB — HEMOGLOBIN A1C
Hgb A1c MFr Bld: 6 % — ABNORMAL HIGH (ref 4.8–5.6)
Mean Plasma Glucose: 126 mg/dL

## 2015-03-11 MED ORDER — OMEGA-3-ACID ETHYL ESTERS 1 G PO CAPS: 1.0000 g | ORAL_CAPSULE | Freq: Two times a day (BID) | ORAL | Status: DC

## 2015-03-11 NOTE — Progress Notes (Signed)
NURSING PROGRESS NOTE  Micheline ChapmanStephanie Troy [id number] Discharge Data: 03/11/2015 5:45 PM Attending Provider: Levert FeinsteinJames M Granfortuna, MD UXL:[id number],OZDGUPCP:LIPSCOMB,LEWIS, MD     Micheline ChapmanStephanie Keegan to be D/C'd Home per MD order.  Discussed with the patient the After Visit Summary and all questions fully answered. All IV's discontinued with no bleeding noted. All belongings returned to patient for patient to take home.   Last Vital Signs:  Blood pressure 152/65, pulse 55, temperature 98.4 F (36.9 C), temperature source Oral, resp. rate 17, height 5' 1.5" (1.562 m), weight 62.3 kg (137 lb 5.6 oz), SpO2 97 %.  Discharge Medication List   Medication List    TAKE these medications        aspirin 81 MG tablet  Take 81 mg by mouth every morning.     CENTRUM ADULTS PO  Take 1 tablet by mouth every morning.     conjugated estrogens vaginal cream  Commonly known as:  PREMARIN  Place 1 g vaginally daily.     methadone 10 MG tablet  Commonly known as:  DOLOPHINE  Take 10 mg by mouth 4 (four) times daily.     omega-3 acid ethyl esters 1 G capsule  Commonly known as:  LOVAZA  Take 1 capsule (1 g total) by mouth 2 (two) times daily.     oxycodone 30 MG immediate release tablet  Commonly known as:  ROXICODONE  Take 30 mg by mouth every 4 (four) hours as needed for pain.     traZODone 50 MG tablet  Commonly known as:  DESYREL  Take 50 mg by mouth at bedtime.

## 2015-03-11 NOTE — Discharge Instructions (Signed)
Please start taking the omega 3 tablet (Lovaza) twice per day. Your primary care doctor will re-test your lipid panel at your appointment. You may need a statin as well, but that can be determined based on your response to the omega 3.   Please also eat a low fat diet and avoid alcohol.  Fat and Cholesterol Control Diet Fat and cholesterol levels in your blood and organs are influenced by your diet. High levels of fat and cholesterol may lead to diseases of the heart, small and large blood vessels, gallbladder, liver, and pancreas. CONTROLLING FAT AND CHOLESTEROL WITH DIET Although exercise and lifestyle factors are important, your diet is key. That is because certain foods are known to raise cholesterol and others to lower it. The goal is to balance foods for their effect on cholesterol and more importantly, to replace saturated and trans fat with other types of fat, such as monounsaturated fat, polyunsaturated fat, and omega-3 fatty acids. On average, a person should consume no more than 15 to 17 g of saturated fat daily. Saturated and trans fats are considered "bad" fats, and they will raise LDL cholesterol. Saturated fats are primarily found in animal products such as meats, butter, and cream. However, that does not mean you need to give up all your favorite foods. Today, there are good tasting, low-fat, low-cholesterol substitutes for most of the things you like to eat. Choose low-fat or nonfat alternatives. Choose round or loin cuts of red meat. These types of cuts are lowest in fat and cholesterol. Chicken (without the skin), fish, veal, and ground [place] breast are great choices. Eliminate fatty meats, such as hot dogs and salami. Even shellfish have little or no saturated fat. Have a 3 oz (85 g) portion when you eat lean meat, poultry, or fish. Trans fats are also called "partially hydrogenated oils." They are oils that have been scientifically manipulated so that they are solid at room temperature  resulting in a longer shelf life and improved taste and texture of foods in which they are added. Trans fats are found in stick margarine, some tub margarines, cookies, crackers, and baked goods.  When baking and cooking, oils are a great substitute for butter. The monounsaturated oils are especially beneficial since it is believed they lower LDL and raise HDL. The oils you should avoid entirely are saturated tropical oils, such as coconut and palm.  Remember to eat a lot from food groups that are naturally free of saturated and trans fat, including fish, fruit, vegetables, beans, grains (barley, rice, couscous, bulgur wheat), and pasta (without cream sauces).  IDENTIFYING FOODS THAT LOWER FAT AND CHOLESTEROL  Soluble fiber may lower your cholesterol. This type of fiber is found in fruits such as apples, vegetables such as broccoli, potatoes, and carrots, legumes such as beans, peas, and lentils, and grains such as barley. Foods fortified with plant sterols (phytosterol) may also lower cholesterol. You should eat at least 2 g per day of these foods for a cholesterol lowering effect.  Read package labels to identify low-saturated fats, trans fat free, and low-fat foods at the supermarket. Select cheeses that have only 2 to 3 g saturated fat per ounce. Use a heart-healthy tub margarine that is free of trans fats or partially hydrogenated oil. When buying baked goods (cookies, crackers), avoid partially hydrogenated oils. Breads and muffins should be made from whole grains (whole-wheat or whole oat flour, instead of "flour" or "enriched flour"). Buy non-creamy canned soups with reduced salt and no added  fats.  FOOD PREPARATION TECHNIQUES  Never deep-fry. If you must fry, either stir-fry, which uses very little fat, or use non-stick cooking sprays. When possible, broil, bake, or roast meats, and steam vegetables. Instead of putting butter or margarine on vegetables, use lemon and herbs, applesauce, and cinnamon  (for squash and sweet potatoes). Use nonfat yogurt, salsa, and low-fat dressings for salads.  LOW-SATURATED FAT / LOW-FAT FOOD SUBSTITUTES Meats / Saturated Fat (g)  Avoid: Steak, marbled (3 oz/85 g) / 11 g  Choose: Steak, lean (3 oz/85 g) / 4 g  Avoid: Hamburger (3 oz/85 g) / 7 g  Choose: Hamburger, lean (3 oz/85 g) / 5 g  Avoid: Ham (3 oz/85 g) / 6 g  Choose: Ham, lean cut (3 oz/85 g) / 2.4 g  Avoid: Chicken, with skin, dark meat (3 oz/85 g) / 4 g  Choose: Chicken, skin removed, dark meat (3 oz/85 g) / 2 g  Avoid: Chicken, with skin, light meat (3 oz/85 g) / 2.5 g  Choose: Chicken, skin removed, light meat (3 oz/85 g) / 1 g Dairy / Saturated Fat (g)  Avoid: Whole milk (1 cup) / 5 g  Choose: Low-fat milk, 2% (1 cup) / 3 g  Choose: Low-fat milk, 1% (1 cup) / 1.5 g  Choose: Skim milk (1 cup) / 0.3 g  Avoid: Hard cheese (1 oz/28 g) / 6 g  Choose: Skim milk cheese (1 oz/28 g) / 2 to 3 g  Avoid: Cottage cheese, 4% fat (1 cup) / 6.5 g  Choose: Low-fat cottage cheese, 1% fat (1 cup) / 1.5 g  Avoid: Ice cream (1 cup) / 9 g  Choose: Sherbet (1 cup) / 2.5 g  Choose: Nonfat frozen yogurt (1 cup) / 0.3 g  Choose: Frozen fruit bar / trace  Avoid: Whipped cream (1 tbs) / 3.5 g  Choose: Nondairy whipped topping (1 tbs) / 1 g Condiments / Saturated Fat (g)  Avoid: Mayonnaise (1 tbs) / 2 g  Choose: Low-fat mayonnaise (1 tbs) / 1 g  Avoid: Butter (1 tbs) / 7 g  Choose: Extra light margarine (1 tbs) / 1 g  Avoid: Coconut oil (1 tbs) / 11.8 g  Choose: Olive oil (1 tbs) / 1.8 g  Choose: Corn oil (1 tbs) / 1.7 g  Choose: Safflower oil (1 tbs) / 1.2 g  Choose: Sunflower oil (1 tbs) / 1.4 g  Choose: Soybean oil (1 tbs) / 2.4 g  Choose: Canola oil (1 tbs) / 1 g Document Released: 12/11/2005 Document Revised: 04/07/2013 Document Reviewed: 03/11/2014 Exit[employer] Patient Information 2015 [place], [place]. This information is not intended to replace advice given to you by  your health care provider. Make sure you discuss any questions you have with your health care provider.

## 2015-03-11 NOTE — Discharge Summary (Signed)
Name: Annette Stewart MRN: [id number] DOB: 06/29/1956 59 y.o. PCP: Rush Farmer, MD  Date of Admission: 03/09/2015  2:06 PM Date of Discharge: 03/11/2015 Attending Physician: Levert Feinstein, MD  Discharge Diagnosis: Principal Problem:   Pancreatitis, acute Active Problems:   Chronic back pain   Hyperlipidemia   Acute pancreatitis  Discharge Medications:   Medication List    TAKE these medications        aspirin 81 MG tablet  Take 81 mg by mouth every morning.     CENTRUM ADULTS PO  Take 1 tablet by mouth every morning.     conjugated estrogens vaginal cream  Commonly known as:  PREMARIN  Place 1 g vaginally daily.     methadone 10 MG tablet  Commonly known as:  DOLOPHINE  Take 10 mg by mouth 4 (four) times daily.     omega-3 acid ethyl esters 1 G capsule  Commonly known as:  LOVAZA  Take 1 capsule (1 g total) by mouth 2 (two) times daily.     oxycodone 30 MG immediate release tablet  Commonly known as:  ROXICODONE  Take 30 mg by mouth every 4 (four) hours as needed for pain.     traZODone 50 MG tablet  Commonly known as:  DESYREL  Take 50 mg by mouth at bedtime.        Disposition and follow-up:   [name] was discharged from [facility] in Stable condition.  At the hospital follow up visit please address:  1.  Pancreatitis and Found to have Hypertriglyceridemia: Symptoms resolved. No stones visualized on CT abdomen (pancreatic study). No history of alcoholism. Mother had hypertriglyceridemia. Started on omega 3 and low fat diet: please reassess for need for statin at follow up visit.  Pre-Diabetes: HgbA1c found to be 6.0%. Please follow.  2.  Labs / imaging needed at time of follow-up: lipid panel  3.  Pending labs/ test needing follow-up: none  Follow-up Appointments:     Follow-up Information    Follow up with Rush Farmer, MD.   Specialty:  Obstetrics and Gynecology   Why:  upcoming scheduled appointment     Contact information:   [address] [place] [address] [phone]       Discharge Instructions:  Please start taking the omega 3 tablet (Lovaza) twice per day. Your primary care doctor will re-test your lipid panel at your appointment. You may need a statin as well, but that can be determined based on your response to the omega 3.   Please also eat a low fat diet and avoid alcohol.  Fat and Cholesterol Control Diet Fat and cholesterol levels in your blood and organs are influenced by your diet. High levels of fat and cholesterol may lead to diseases of the heart, small and large blood vessels, gallbladder, liver, and pancreas. CONTROLLING FAT AND CHOLESTEROL WITH DIET Although exercise and lifestyle factors are important, your diet is key. That is because certain foods are known to raise cholesterol and others to lower it. The goal is to balance foods for their effect on cholesterol and more importantly, to replace saturated and trans fat with other types of fat, such as monounsaturated fat, polyunsaturated fat, and omega-3 fatty acids. On average, a person should consume no more than 15 to 17 g of saturated fat daily. Saturated and trans fats are considered "bad" fats, and they will raise LDL cholesterol. Saturated fats are primarily found in animal products such as meats, butter, and cream.  However, that does not mean you need to give up all your favorite foods. Today, there are good tasting, low-fat, low-cholesterol substitutes for most of the things you like to eat. Choose low-fat or nonfat alternatives. Choose round or loin cuts of red meat. These types of cuts are lowest in fat and cholesterol. Chicken (without the skin), fish, veal, and ground [place] breast are great choices. Eliminate fatty meats, such as hot dogs and salami. Even shellfish have little or no saturated fat. Have a 3 oz (85 g) portion when you eat lean meat, poultry, or fish. Trans fats are also called  "partially hydrogenated oils." They are oils that have been scientifically manipulated so that they are solid at room temperature resulting in a longer shelf life and improved taste and texture of foods in which they are added. Trans fats are found in stick margarine, some tub margarines, cookies, crackers, and baked goods.  When baking and cooking, oils are a great substitute for butter. The monounsaturated oils are especially beneficial since it is believed they lower LDL and raise HDL. The oils you should avoid entirely are saturated tropical oils, such as coconut and palm.  Remember to eat a lot from food groups that are naturally free of saturated and trans fat, including fish, fruit, vegetables, beans, grains (barley, rice, couscous, bulgur wheat), and pasta (without cream sauces).  IDENTIFYING FOODS THAT LOWER FAT AND CHOLESTEROL  Soluble fiber may lower your cholesterol. This type of fiber is found in fruits such as apples, vegetables such as broccoli, potatoes, and carrots, legumes such as beans, peas, and lentils, and grains such as barley. Foods fortified with plant sterols (phytosterol) may also lower cholesterol. You should eat at least 2 g per day of these foods for a cholesterol lowering effect.  Read package labels to identify low-saturated fats, trans fat free, and low-fat foods at the supermarket. Select cheeses that have only 2 to 3 g saturated fat per ounce. Use a heart-healthy tub margarine that is free of trans fats or partially hydrogenated oil. When buying baked goods (cookies, crackers), avoid partially hydrogenated oils. Breads and muffins should be made from whole grains (whole-wheat or whole oat flour, instead of "flour" or "enriched flour"). Buy non-creamy canned soups with reduced salt and no added fats.  FOOD PREPARATION TECHNIQUES  Never deep-fry. If you must fry, either stir-fry, which uses very little fat, or use non-stick cooking sprays. When possible, broil, bake, or roast  meats, and steam vegetables. Instead of putting butter or margarine on vegetables, use lemon and herbs, applesauce, and cinnamon (for squash and sweet potatoes). Use nonfat yogurt, salsa, and low-fat dressings for salads.  LOW-SATURATED FAT / LOW-FAT FOOD SUBSTITUTES Meats / Saturated Fat (g)  Avoid: Steak, marbled (3 oz/85 g) / 11 g  Choose: Steak, lean (3 oz/85 g) / 4 g  Avoid: Hamburger (3 oz/85 g) / 7 g  Choose: Hamburger, lean (3 oz/85 g) / 5 g  Avoid: Ham (3 oz/85 g) / 6 g  Choose: Ham, lean cut (3 oz/85 g) / 2.4 g  Avoid: Chicken, with skin, dark meat (3 oz/85 g) / 4 g  Choose: Chicken, skin removed, dark meat (3 oz/85 g) / 2 g  Avoid: Chicken, with skin, light meat (3 oz/85 g) / 2.5 g  Choose: Chicken, skin removed, light meat (3 oz/85 g) / 1 g Dairy / Saturated Fat (g)  Avoid: Whole milk (1 cup) / 5 g  Choose: Low-fat milk, 2% (1 cup) /  3 g  Choose: Low-fat milk, 1% (1 cup) / 1.5 g  Choose: Skim milk (1 cup) / 0.3 g  Avoid: Hard cheese (1 oz/28 g) / 6 g  Choose: Skim milk cheese (1 oz/28 g) / 2 to 3 g  Avoid: Cottage cheese, 4% fat (1 cup) / 6.5 g  Choose: Low-fat cottage cheese, 1% fat (1 cup) / 1.5 g  Avoid: Ice cream (1 cup) / 9 g  Choose: Sherbet (1 cup) / 2.5 g  Choose: Nonfat frozen yogurt (1 cup) / 0.3 g  Choose: Frozen fruit bar / trace  Avoid: Whipped cream (1 tbs) / 3.5 g  Choose: Nondairy whipped topping (1 tbs) / 1 g Condiments / Saturated Fat (g)  Avoid: Mayonnaise (1 tbs) / 2 g  Choose: Low-fat mayonnaise (1 tbs) / 1 g  Avoid: Butter (1 tbs) / 7 g  Choose: Extra light margarine (1 tbs) / 1 g  Avoid: Coconut oil (1 tbs) / 11.8 g  Choose: Olive oil (1 tbs) / 1.8 g  Choose: Corn oil (1 tbs) / 1.7 g  Choose: Safflower oil (1 tbs) / 1.2 g  Choose: Sunflower oil (1 tbs) / 1.4 g  Choose: Soybean oil (1 tbs) / 2.4 g  Choose: Canola oil (1 tbs) / 1 g Document Released: 12/11/2005 Document Revised: 04/07/2013 Document Reviewed:  03/11/2014 Exit[employer] Patient Information 2015 [place], [place]. This information is not intended to replace advice given to you by your health care provider. Make sure you discuss any questions you have with your health care provider.  Consultations: none    Procedures Performed:  Dg Chest 2 View  03/09/2015   CLINICAL DATA:  Central chest pain started last night. Pain radiates to the back. Pain this morning when she awoke. Pain lasts 2-3 minutes. Pain worse with with breathing, palpation. History of smoking.  EXAM: CHEST  2 VIEW  COMPARISON:  None.  FINDINGS: Heart size is normal. There is mild perihilar peribronchial thickening. No focal consolidations or pleural effusions are identified. No pulmonary edema. Visualized osseous structures have a normal appearance. Previous anterior cervical fusion. Surgical clips are noted in the right upper quadrant of the abdomen.  IMPRESSION: 1. Mild bronchitic changes. 2.  No focal acute pulmonary abnormality.   Electronically Signed   By: Norva Pavlov M.D.   On: 03/09/2015 12:54   Ct Abd Wo & W Cm  03/10/2015   CLINICAL DATA:  Epigastric pain radiating into the back. Mildly elevated triglycerides and lipase levels. History of cholecystectomy and hysterectomy. Evaluate for retained calculus or pancreatic head mass. Initial encounter.  EXAM: CT ABDOMEN WITHOUT AND WITH CONTRAST  TECHNIQUE: Multidetector CT imaging of the abdomen was performed following the standard protocol before and following the bolus administration of intravenous contrast.  CONTRAST:  OMNIPAQUE IOHEXOL 350 MG/ML SOLN  COMPARISON:  None.  FINDINGS: Lower chest: Minimal linear scarring at the right lung base. Otherwise clear lung bases. No pleural or pericardial effusion. Bilateral breast implants and small hiatal hernia noted.  Hepatobiliary: Diffuse hepatic steatosis. Areas of focal fat are noted adjacent to the falciform ligament and cholecystectomy bed. No suspicious hepatic lesions are  identified. However, minimal contour irregularity of the liver and relatively enlargement of the caudate lobe are noted such that early cirrhosis cannot be excluded. Mild extrahepatic biliary dilatation status post cholecystectomy. The common bile duct measures 8 mm. No evidence of choledocholithiasis.  Pancreas: No evidence of pancreatic mass or pancreatic ductal dilatation. The pancreas appears normal  without surrounding inflammation.  Spleen: Normal in size without focal abnormality.  Adrenals/Urinary Tract: Both adrenal glands appear normal.No evidence of renal mass, hydronephrosis or urinary tract calculus. There are probable tiny renal cysts bilaterally.  Stomach/Bowel: No evidence of bowel wall thickening, distention or surrounding inflammatory change.  Vascular/Lymphatic: There are no enlarged abdominal lymph nodes. Moderate aortoiliac atherosclerosis for age. No evidence of large vessel occlusion.  Other: No evidence of abdominal wall mass or hernia.  Musculoskeletal: No acute or significant osseous findings. Lower lumbar spondylosis noted with degenerative anterolisthesis at L4-5.  IMPRESSION: 1. No evidence of pancreatic mass. 2. Mild extrahepatic biliary prominence status post cholecystectomy, within physiologic limits. No CT evidence of choledocholithiasis. Ultrasound and MRCP are more sensitive for evaluation of choledocholithiasis. 3. Hepatic steatosis.  Early cirrhosis cannot be excluded. 4. Moderate atherosclerosis. 5. No acute findings demonstrated.   Electronically Signed   By: Carey BullocksWilliam  Veazey M.D.   On: 03/10/2015 12:50    Admission HPI: This is a 59 year old woman with past medical history of chronic low back pain who presents to the emergency department with epigastric pain that radiates to the back. Patient describes her pain as dull, right in the middle of her chest going to the back. She describes some nausea associated with the pain but without vomiting. Her bowel movements have been  normal with the last one being 2 this morning. She states that her pain is an 8/10, non-positional and nonexertional. She states that pain somehow is worse when he takes a deep breath during its peak. The pain increased when she attempted to eat a [place] toast this morning. Otherwise, normally she has no pain with food. It was happening intermittently every a few minutes, but has since improved after arrival to the ED with pain medications. She denies shortness of breath or palpitations, DOE, or PND. No leg swelling. No dizziness. No lightheadedness. She denies any urinary symptoms. No constitutional symptoms like chills, fevers, or increased fatigue. This is the index episode. She has a history of cholecystectomy. She does not consume alcohol. No recent surgery or long trips.  Hospital Course by problem list: Principal Problem:   Pancreatitis, acute Active Problems:   Chronic back pain   Hyperlipidemia   Acute pancreatitis   [name] is a [id number] woman who was admitted for pancreatitis. She had a lipase of 780 and a triglyeride level of 502. She is not an alcoholic and is s/p cholecystectomy. Her abdominal CT (pancreatic study) showed no pancreatic mass or choledocholithiasis. Her symptoms are mild and she successfully advanced her diet to regular foods.  Mild Acute Pancreatitis: Patient has a family history of hypertriglyceridemia (mother) and she has been found to have hypertriglyceridemia here. She does not have a history of alcohol abuse and does not currently drink any alcohol. She is status post cholecystectomy and her bilirubin levels are normal. At CT with pancreatic protocol was performed here to rule out residual stone; it was negative for mass and choledocolithiasis. It did visualize some hepatic steatosis. It is most likely that her acute pancreatitis is secondary to her hypertriglyceridemia or is idiopathic.   Hyperlipidemia Patient has elevated cholesterol, low HDL and elevated  triglycerides. She will need to initiate treatment. If she had cardiovascular risk factors, she would need to be started on a statin. Given that she currently does not, and that she prefers to start treatment with something less intense, she was started on omega 3 on this hospitalization. Suggest repeat lipid panel at PCP appointment  and adjustment  or gemfibrozil along with fat restricted diet. Patient prefers omega 3. Diet counseling was also initiated. Will follow with her PCP.  Hepatic Steatosis (Incidental): Seen on CT. LFTs normal. Outpatient follow up.  Chronic Back Pain: Patient is on a heavy home pain regimen that involves methadone 10 mg QID and oxycodone 30 mg TID PRN.  Pre-Diabetes: Glucose 219 on admission. No known history of DM, but mother had DMII. HgbA1c 6.0%.  Discharge Vitals:   BP 145/67 mmHg  Pulse 56  Temp(Src) 98.4 F (36.9 C) (Oral)  Resp 20  Ht 5' 1.5" (1.562 m)  Wt 137 lb 5.6 oz (62.3 kg)  BMI 25.53 kg/m2  SpO2 94%  Discharge Labs:  Results for orders placed or performed during the hospital encounter of 03/09/15 (from the past 24 hour(s))  Glucose, capillary     Status: None   Collection Time: 03/11/15  5:54 AM  Result Value Ref Range   Glucose-Capillary 82 70 - 99 mg/dL    Signed: Stark Bray, MD 03/11/2015, 8:29 AM    Services Ordered on Discharge: none Equipment Ordered on Discharge: none

## 2015-03-11 NOTE — Progress Notes (Signed)
Subjective: Annette Stewart is doing well today. She is not in pain unless she presses on her abdomen, and she is not nauseous. Last night, she was concerned about some abdominal bloating; today, it has resolved.  Interval Events:  - Patient was able to eat several graham crackers last night and soft dinner - Normal breakfast at bedside  Objective: Vital signs in last 24 hours: Filed Vitals:   03/10/15 0640 03/10/15 1424 03/10/15 2210 03/11/15 0537  BP: 107/50 105/52 142/59 145/67  Pulse: 58 56    Temp: 98.2 F (36.8 C) 98.7 F (37.1 C) 99 F (37.2 C) 98.4 F (36.9 C)  TempSrc: Oral Oral Oral Oral  Resp: Height:      Weight:      SpO2: 92% 96% 93% 94%   Weight change:   Intake/Output Summary (Last 24 hours) at 03/11/15 0739 Last data filed at 03/11/15 0600  Gross per 24 hour  Intake    540 ml  Output   3050 ml  Net  -2510 ml   Physical Exam: Appearance: in NAD, resting in bed HEENT: AT/[place], PERRL, EOMi Heart: RRR, normal S1S2, no murmurs Lungs: CTAB, no wheezing Abdomen: BS present, soft, tender only in epigastric area, no rebound Musculoskeletal: normal range of motion with no edema Neurologic: A&Ox3, grossly normal Skin: normal color, normal turgor, no rashes or lesions  Lab Results: Basic Metabolic Panel:  Recent Labs Lab 03/09/15 1145  NA 137  K 3.7  CL 99  CO2 28  GLUCOSE 219*  BUN 11  CREATININE 0.75  CALCIUM 9.4   Liver Function Tests:  Recent Labs Lab 03/09/15 2139  AST 22  ALT 33  ALKPHOS 73  BILITOT 1.1  PROT 6.5  ALBUMIN 3.8    Recent Labs Lab 03/09/15 1429  LIPASE 780*   CBC:  Recent Labs Lab 03/09/15 1145  WBC 12.3*  HGB 14.1  HCT 40.6  MCV 89.8  PLT 252    Cardiac Enzymes:  Recent Labs Lab 03/09/15 2139 03/10/15 0050 03/10/15 0638  TROPONINI <0.03 <0.03 <0.03   D-Dimer:  Recent Labs Lab 03/09/15 1458  DDIMER 1.22*   Fasting Lipid Panel:  Recent Labs Lab 03/09/15 2139  CHOL 224*  HDL  37*  LDLCALC UNABLE TO CALCULATE IF TRIGLYCERIDE OVER 400 mg/dL  TRIG 161*  CHOLHDL 6.1   Urine Drug Screen: Drugs of Abuse     Component Value Date/Time   LABOPIA POSITIVE* 11/22/2008 1415   COCAINSCRNUR NONE DETECTED 11/22/2008 1415   LABBENZ POSITIVE* 11/22/2008 1415   AMPHETMU NONE DETECTED 11/22/2008 1415   THCU NONE DETECTED 11/22/2008 1415   LABBARB  11/22/2008 1415    NONE DETECTED        DRUG SCREEN FOR MEDICAL PURPOSES ONLY.  IF CONFIRMATION IS NEEDED FOR ANY PURPOSE, NOTIFY LAB WITHIN 5 DAYS.    Micro Results: No results found for this or any previous visit (from the past 240 hour(s)). Studies/Results: Dg Chest 2 View  03/09/2015   CLINICAL DATA:  Central chest pain started last night. Pain radiates to the back. Pain this morning when she awoke. Pain lasts 2-3 minutes. Pain worse with with breathing, palpation. History of smoking.  EXAM: CHEST  2 VIEW  COMPARISON:  None.  FINDINGS: Heart size is normal. There is mild perihilar peribronchial thickening. No focal consolidations or pleural effusions are identified. No pulmonary edema. Visualized osseous structures have a normal appearance. Previous anterior cervical fusion. Surgical clips are noted in  the right upper quadrant of the abdomen.  IMPRESSION: 1. Mild bronchitic changes. 2.  No focal acute pulmonary abnormality.   Electronically Signed   By: Norva PavlovElizabeth  Brown M.D.   On: 03/09/2015 12:54   Ct Abd Wo & W Cm  03/10/2015   CLINICAL DATA:  Epigastric pain radiating into the back. Mildly elevated triglycerides and lipase levels. History of cholecystectomy and hysterectomy. Evaluate for retained calculus or pancreatic head mass. Initial encounter.  EXAM: CT ABDOMEN WITHOUT AND WITH CONTRAST  TECHNIQUE: Multidetector CT imaging of the abdomen was performed following the standard protocol before and following the bolus administration of intravenous contrast.  CONTRAST:  100mL OMNIPAQUE IOHEXOL 350 MG/ML SOLN  COMPARISON:  None.   FINDINGS: Lower chest: Minimal linear scarring at the right lung base. Otherwise clear lung bases. No pleural or pericardial effusion. Bilateral breast implants and small hiatal hernia noted.  Hepatobiliary: Diffuse hepatic steatosis. Areas of focal fat are noted adjacent to the falciform ligament and cholecystectomy bed. No suspicious hepatic lesions are identified. However, minimal contour irregularity of the liver and relatively enlargement of the caudate lobe are noted such that early cirrhosis cannot be excluded. Mild extrahepatic biliary dilatation status post cholecystectomy. The common bile duct measures 8 mm. No evidence of choledocholithiasis.  Pancreas: No evidence of pancreatic mass or pancreatic ductal dilatation. The pancreas appears normal without surrounding inflammation.  Spleen: Normal in size without focal abnormality.  Adrenals/Urinary Tract: Both adrenal glands appear normal.No evidence of renal mass, hydronephrosis or urinary tract calculus. There are probable tiny renal cysts bilaterally.  Stomach/Bowel: No evidence of bowel wall thickening, distention or surrounding inflammatory change.  Vascular/Lymphatic: There are no enlarged abdominal lymph nodes. Moderate aortoiliac atherosclerosis for age. No evidence of large vessel occlusion.  Other: No evidence of abdominal wall mass or hernia.  Musculoskeletal: No acute or significant osseous findings. Lower lumbar spondylosis noted with degenerative anterolisthesis at L4-5.  IMPRESSION: 1. No evidence of pancreatic mass. 2. Mild extrahepatic biliary prominence status post cholecystectomy, within physiologic limits. No CT evidence of choledocholithiasis. Ultrasound and MRCP are more sensitive for evaluation of choledocholithiasis. 3. Hepatic steatosis.  Early cirrhosis cannot be excluded. 4. Moderate atherosclerosis. 5. No acute findings demonstrated.   Electronically Signed   By: Carey BullocksWilliam  Veazey M.D.   On: 03/10/2015 12:50   CT  pending  Medications: I have reviewed the patient's current medications. Scheduled Meds: . aspirin EC  81 mg Oral Daily  . enoxaparin (LOVENOX) injection  40 mg Subcutaneous Q24H  . methadone  10 mg Oral QID  . omega-3 acid ethyl esters  1 g Oral BID  . traZODone  50 mg Oral QHS   Continuous Infusions:  PRN Meds:.ondansetron (ZOFRAN) IV, oxycodone Assessment/Plan: Principal Problem:   Pancreatitis, acute Active Problems:   Chronic back pain   Hyperlipidemia   Acute pancreatitis  Annette Stewart is a [age] woman who was admitted for pancreatitis. She had a lipase of 780 and a triglyeride level of 502. She is not an alcoholic and is s/p cholecystectomy. Her abdominal CT (pancreatic study) showed no pancreatic mass, choledocholithiasis. Her symptoms are mild and she would like to advance her diet. She is ready for discharge with follow up with her PCP.  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 She does not have a history of alcohol abuse and does not currently drink any alcohol. She is status post cholecystectomy and her bilirubin levels are normal.  It is most likely that her acute pancreatitis is secondary to her hypertriglyceridemia, but we will proceed with imaging to better visualize her pancreas to rule out a retained stone that could be contributing. - CT with and without contrast (pancreatic protocol)  - Advance diet as tolerated, clear liquids - Pain has been well controlled on home methadone and oxycodone along with morphine for breakthrough pain (q4 hours PRN) - IVF were given for 15 hours at 200 mL/hr; expired at 9:00am - Zofran 4 mg q6 hours PRN; patient has not required this - Will need treatment for her hyperlipidemia (see below)  - D/c telemetry  Hyperlipidemia Patient has elevated cholesterol, low HDL and elevated triglycerides. She will need to initiate treatment. If she had  cardiovascular risk factors, she should be started on a statin. Given that she currently does not, consider omega 3 or gemfibrozil along with fat restricted diet. Patient prefers omega 3. Will follow with her PCP. - Diet counseling - Start omega 3 (patient's preference)  Hepatic Steatosis (Incidental): Seen on CT. LFTs normal. - Outpatient follow up  Chronic Back Pain: Patient is on a heavy home pain regimen that involves methadone 10 mg QID and oxycodone 30 mg TID PRN. - Continue home pain regimen  Pre-Diabetes: Glucose 219 on admission. No known history of DM, but mother had DMII. HgbA1c 6.0%.  Diet: Advancing as tolerated  DVT Ppx: sq lovenox  Dispo: Disposition is deferred at this time, awaiting improvement of current medical problems.  Anticipated discharge in approximately 0 day(s).   The patient does have a current PCP Rush Farmer, MD) and does not need an [facility] hospital follow-up appointment after discharge.  The patient does not have transportation limitations that hinder transportation to clinic appointments.  .Services Needed at time of discharge: Y = Yes, Blank = No PT:   OT:   RN:   Equipment:   Other:     LOS: 2 days   Stark Bray, MD 03/11/2015, 7:39 AM

## 2015-12-04 IMAGING — DX DG CHEST 2V
2 series · 2 of 2 positions shown · non-contrast
Comparison: None.

CLINICAL DATA: Central chest pain started last night. Pain radiates
to the back. Pain this morning when she awoke. Pain lasts 2-3
minutes. Pain worse with with breathing, palpation. History of
smoking.

EXAM:
CHEST  2 VIEW

[chest pa]
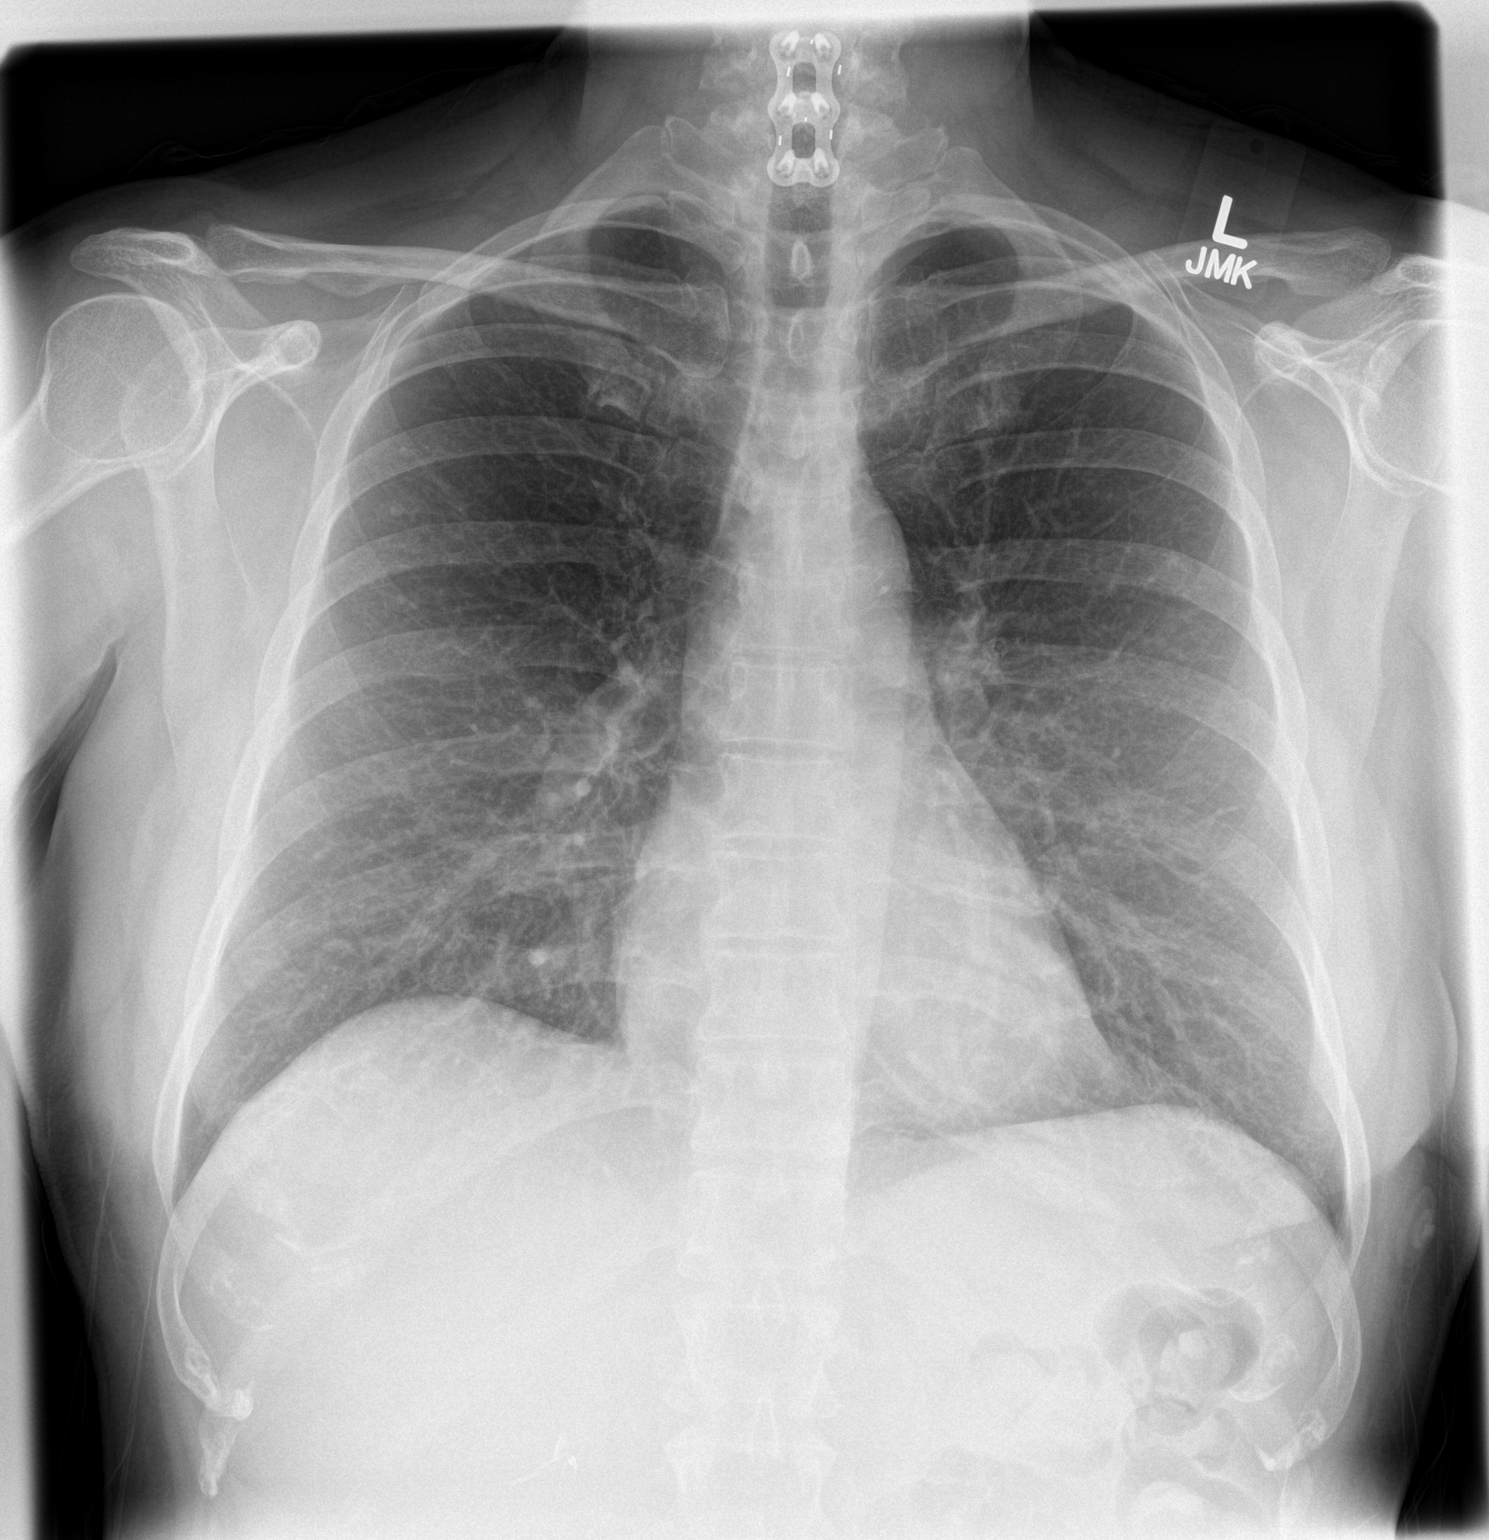

[chest lat]
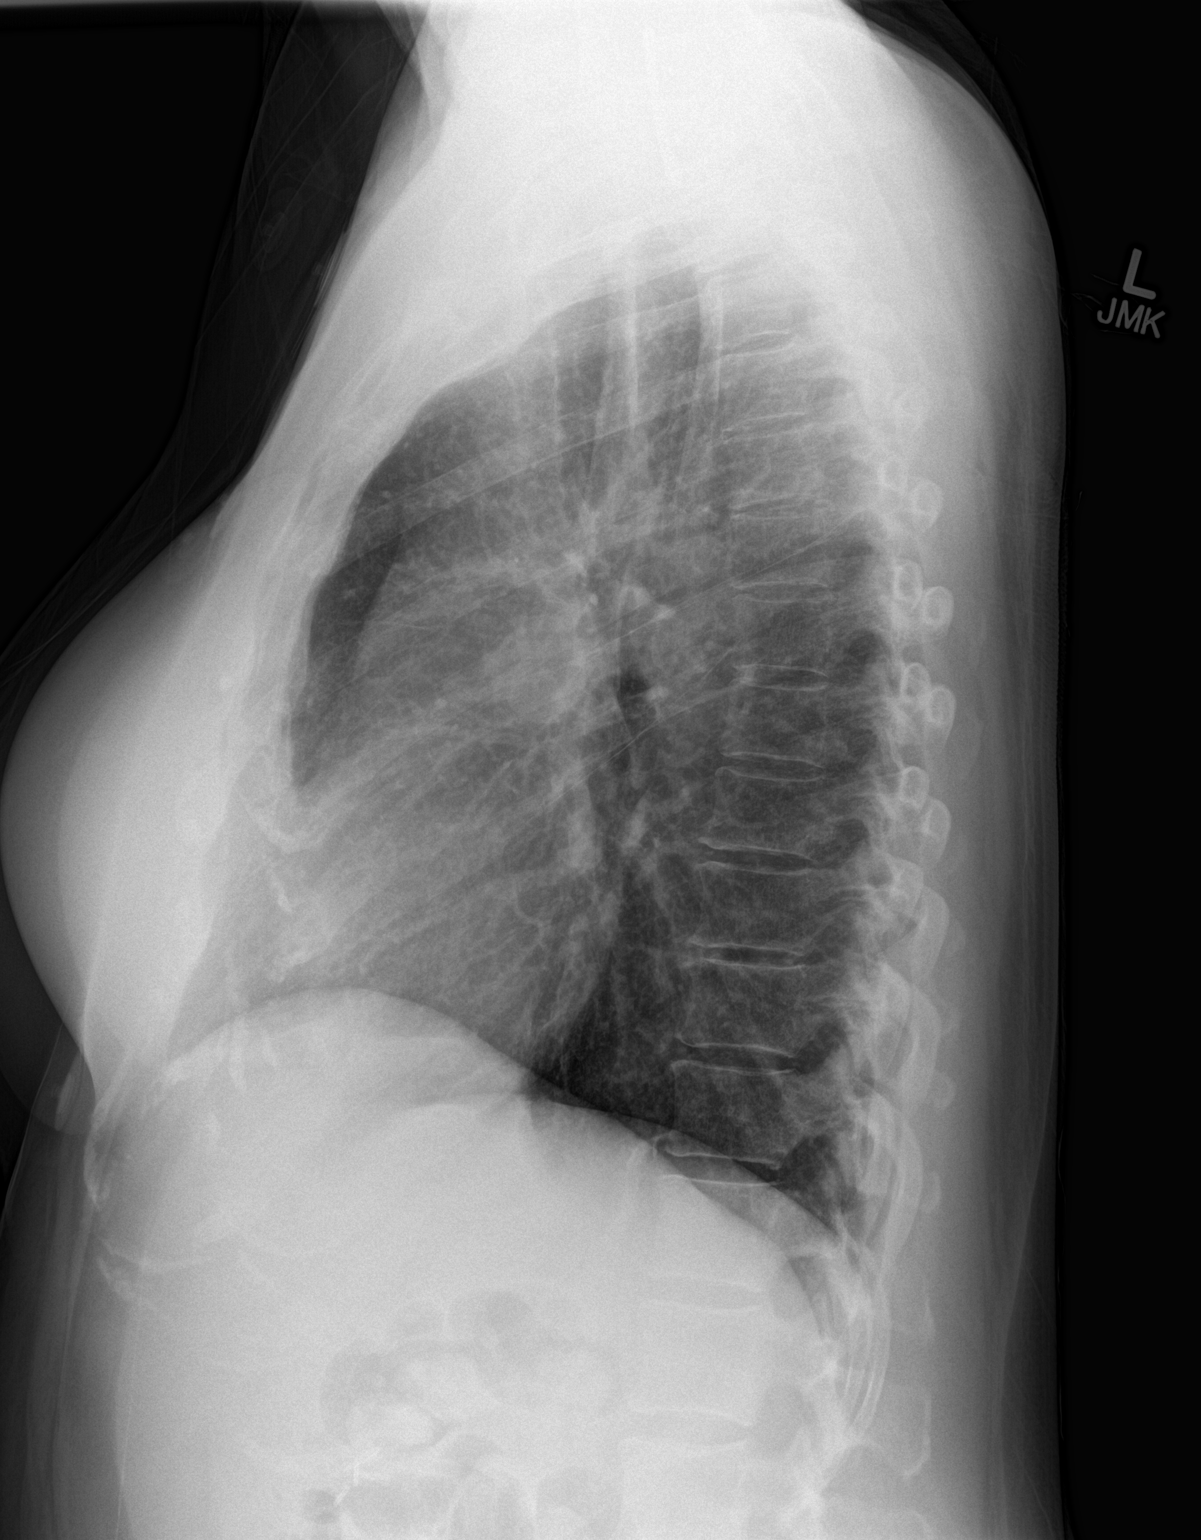

[2 of 2 positions shown; findings below may reference images not displayed]

FINDINGS: Heart size is normal. There is mild perihilar peribronchial
thickening. No focal consolidations or pleural effusions are
identified. No pulmonary edema. Visualized osseous structures have a
normal appearance. Previous anterior cervical fusion. Surgical clips
are noted in the right upper quadrant of the abdomen.
IMPRESSION: 1. Mild bronchitic changes.
2.  No focal acute pulmonary abnormality.

## 2015-12-05 IMAGING — CT CT ABDOMEN WO/W CM
2 of 12 series · 3 of 46 positions shown, 5 images · IV contrast (Iodine)
Comparison: None.

CLINICAL DATA: Epigastric pain radiating into the back. Mildly
elevated triglycerides and lipase levels. History of cholecystectomy
and hysterectomy. Evaluate for retained calculus or pancreatic head
mass. Initial encounter.

EXAM:
CT ABDOMEN WITHOUT AND WITH CONTRAST
TECHNIQUE: Multidetector CT imaging of the abdomen was performed following the
standard protocol before and following the bolus administration of
intravenous contrast.
CONTRAST:  100mL OMNIPAQUE IOHEXOL 350 MG/ML SOLN

[Series 304: coronal mips · coronal · 0.50mm/px · 2 of 73 slices shown, 3 images]
[im 25/73  soft-tissue]
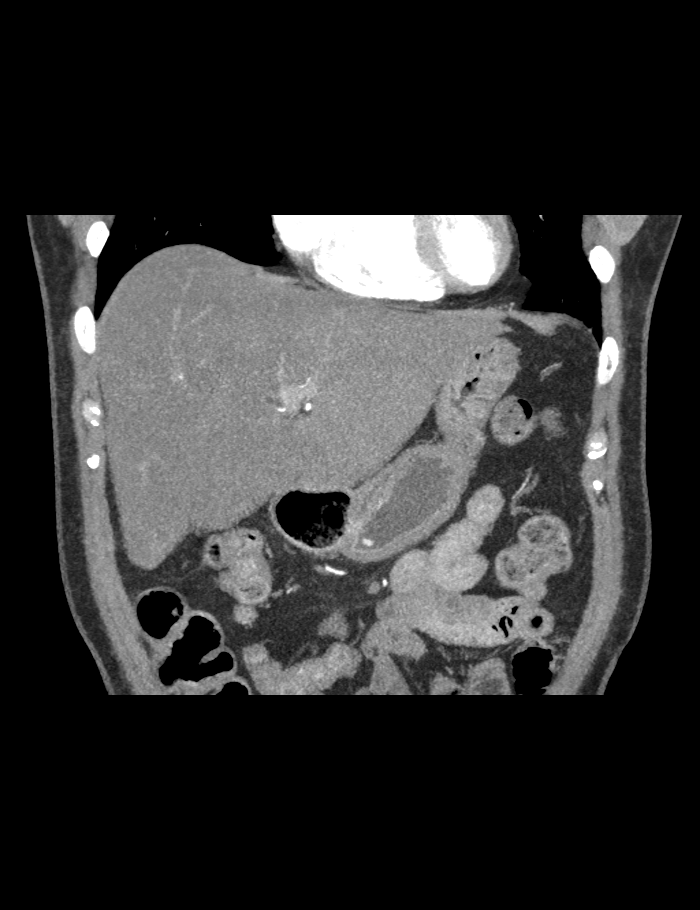
[im 25/73  bone]
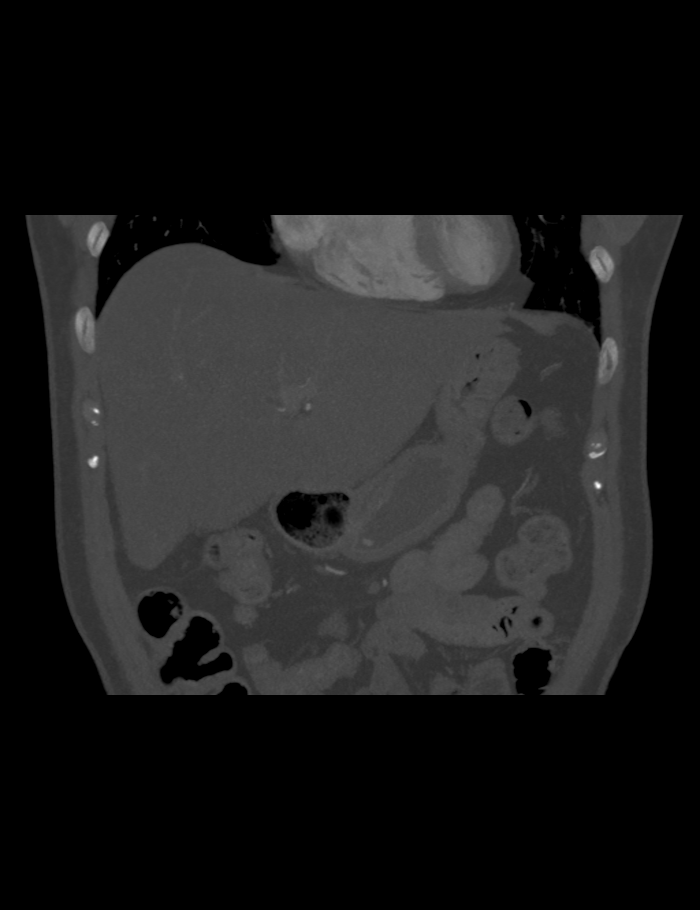
[im 49/73  soft-tissue]
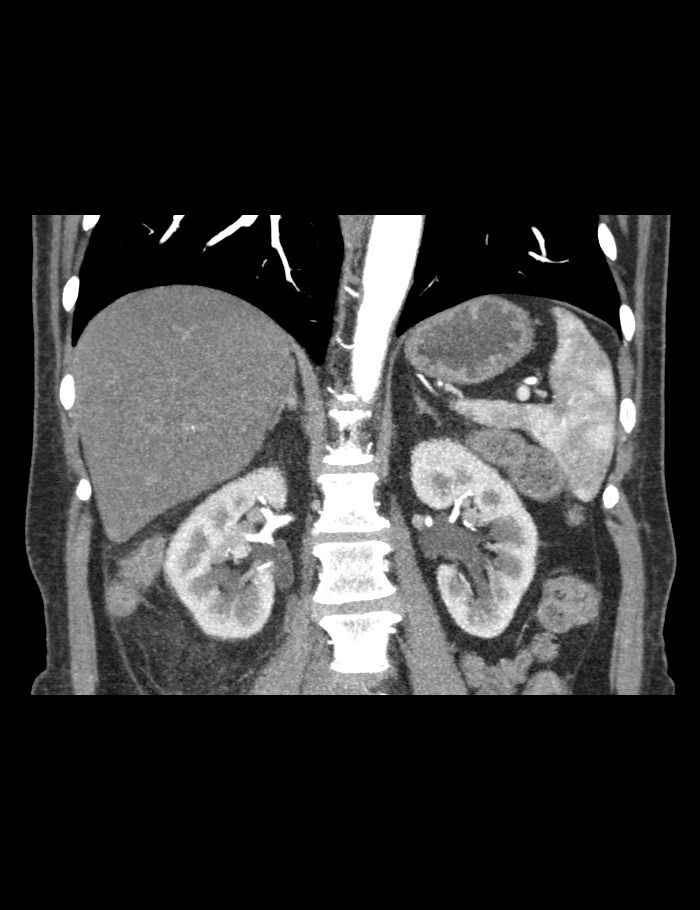

[Series 406: coronals mips · coronal · 0.68mm/px · 1 of 74 slices shown, 2 images]
[im 37/74  soft-tissue]
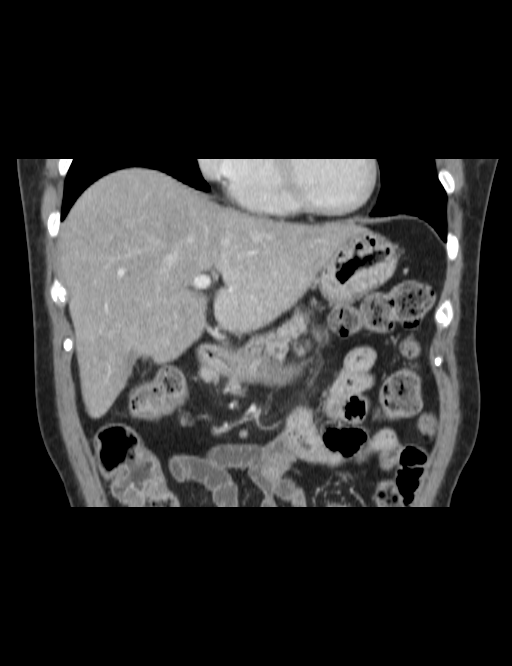
[im 37/74  bone]
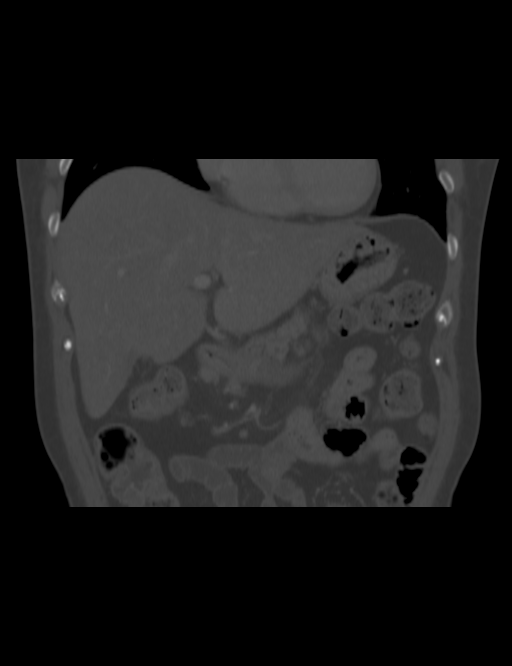

[3 of 46 positions shown; findings below may reference images not displayed]

FINDINGS: Lower chest: Minimal linear scarring at the right lung base.
Otherwise clear lung bases. No pleural or pericardial effusion.
Bilateral breast implants and small hiatal hernia noted.

Hepatobiliary: Diffuse hepatic steatosis. Areas of focal fat are
noted adjacent to the falciform ligament and cholecystectomy bed. No
suspicious hepatic lesions are identified. However, minimal contour
irregularity of the liver and relatively enlargement of the caudate
lobe are noted such that early cirrhosis cannot be excluded. Mild
extrahepatic biliary dilatation status post cholecystectomy. The
common bile duct measures 8 mm. No evidence of choledocholithiasis.

Pancreas: No evidence of pancreatic mass or pancreatic ductal
dilatation. The pancreas appears normal without surrounding
inflammation.

Spleen: Normal in size without focal abnormality.

Adrenals/Urinary Tract: Both adrenal glands appear normal.No
evidence of renal mass, hydronephrosis or urinary tract calculus.
There are probable tiny renal cysts bilaterally.

Stomach/Bowel: No evidence of bowel wall thickening, distention or
surrounding inflammatory change.

Vascular/Lymphatic: There are no enlarged abdominal lymph nodes.
Moderate aortoiliac atherosclerosis for age. No evidence of large
vessel occlusion.

Other: No evidence of abdominal wall mass or hernia.

Musculoskeletal: No acute or significant osseous findings. Lower
lumbar spondylosis noted with degenerative anterolisthesis at L4-5.
IMPRESSION: 1. No evidence of pancreatic mass.
2. Mild extrahepatic biliary prominence status post cholecystectomy,
within physiologic limits. No CT evidence of choledocholithiasis.
Ultrasound and MRCP are more sensitive for evaluation of
choledocholithiasis.
3. Hepatic steatosis.  Early cirrhosis cannot be excluded.
4. Moderate atherosclerosis.
5. No acute findings demonstrated.

## 2016-02-23 DIAGNOSIS — Z9289 Personal history of other medical treatment: Secondary | ICD-10-CM

## 2016-02-23 HISTORY — DX: Personal history of other medical treatment: Z92.89

## 2016-07-14 HISTORY — PX: LUMBAR LAMINECTOMY: SHX95

## 2016-10-18 HISTORY — PX: SYNOVIAL CYST EXCISION: SUR507

## 2017-11-23 HISTORY — PX: GREAT TOE ARTHRODESIS, METATARSALPHALANGEAL JOINT: SUR56

## 2017-12-25 DIAGNOSIS — F445 Conversion disorder with seizures or convulsions: Secondary | ICD-10-CM

## 2017-12-25 DIAGNOSIS — R569 Unspecified convulsions: Secondary | ICD-10-CM

## 2017-12-25 HISTORY — DX: Unspecified convulsions: R56.9

## 2017-12-25 HISTORY — DX: Conversion disorder with seizures or convulsions: F44.5

## 2019-08-02 ENCOUNTER — Emergency Department (HOSPITAL_COMMUNITY)
Admission: EM | Admit: 2019-08-02 | Discharge: 2019-08-02 | Discharge status: Against Medical Advice | Payer: Managed Care, Other (non HMO) | Attending: Emergency Medicine | Admitting: Emergency Medicine

## 2019-08-02 ENCOUNTER — Other Ambulatory Visit: Payer: Self-pay

## 2019-08-02 ENCOUNTER — Encounter (HOSPITAL_COMMUNITY): Payer: Self-pay | Admitting: *Deleted

## 2019-08-02 DIAGNOSIS — M79605 Pain in left leg: Secondary | ICD-10-CM | POA: Insufficient documentation

## 2019-08-02 DIAGNOSIS — Z5321 Procedure and treatment not carried out due to patient leaving prior to being seen by health care provider: Secondary | ICD-10-CM | POA: Insufficient documentation

## 2019-08-02 DIAGNOSIS — M545 Low back pain: Secondary | ICD-10-CM | POA: Insufficient documentation

## 2019-08-02 NOTE — ED Notes (Signed)
Pt came up to this tech requesting to leave AMA. Pt ride is here. Spoke to [employer] and she states ok for pt to leave, pt IV is removed.

## 2019-08-02 NOTE — ED Notes (Signed)
Pt is requesting to leave after triage. Have spoken with patient and she is aware of risk and insists on leaving. Iv was removed.

## 2019-08-02 NOTE — ED Triage Notes (Signed)
Pt arrived by [facility] for pseudoseizure. Reports having a seizure and tripped over a coffee table and now has lower back pain and left leg pain.

## 2020-03-25 DIAGNOSIS — Z9889 Other specified postprocedural states: Secondary | ICD-10-CM

## 2020-03-25 HISTORY — PX: ESOPHAGOGASTRODUODENOSCOPY (EGD) WITH ESOPHAGEAL DILATION: SHX5812

## 2020-03-25 HISTORY — DX: Other specified postprocedural states: Z98.890

## 2020-09-24 HISTORY — PX: HARDWARE REMOVAL: SHX979

## 2020-10-25 HISTORY — PX: COLONOSCOPY: SHX174

## 2021-05-25 HISTORY — PX: CARPAL TUNNEL RELEASE: SHX101

## 2021-08-27 NOTE — H&P (Signed)
Preoperative History & Physical Exam  Surgeon: Philipp Ovens, MD  Diagnosis: Right finger trigger finger  Planned Procedure: Procedure(s) (LRB): RELEASE TRIGGER FINGER/A-1 PULLEY, right ring finger (Right)  History of Present Illness:   Patient is a 65 y.o. female with symptoms consistent with  Right finger trigger finger who presents for surgical intervention. The risks, benefits and alternatives of surgical intervention were discussed and informed consent was obtained prior to surgery.  Past Medical History:  Past Medical History:  Diagnosis Date   Acute pancreatitis 03/09/2015   Chronic lower back pain    Depression    "hx" (03/09/2015)   GERD (gastroesophageal reflux disease)    Sciatic pain    Sleep apnea    "don't wear mask" (03/09/2015)    Past Surgical History:  Past Surgical History:  Procedure Laterality Date   ABDOMINAL HYSTERECTOMY  1985   BLADDER SURGERY  2013   "repaired bladder prolapse repair done in 2011"   BLADDER SUSPENSION  2011   BREAST LUMPECTOMY Right ~ 2001   CARPAL TUNNEL RELEASE Bilateral 1989   DILATION AND CURETTAGE OF UTERUS  before 1985   KNEE ARTHROSCOPY Right 2009   LAPAROSCOPIC CHOLECYSTECTOMY  2007   MASTECTOMY Bilateral 2005   TUBAL LIGATION     VAGINAL PROLAPSE REPAIR  2011   w/bladder prolapse repair"    Medications:  Prior to Admission medications   Medication Sig Start Date End Date Taking? Authorizing Provider  aspirin 81 MG tablet Take 81 mg by mouth every morning.    [provider]  methadone (DOLOPHINE) 10 MG tablet Take 10 mg by mouth 4 (four) times daily.    [provider]  Multiple Vitamins-Minerals (CENTRUM ADULTS PO) Take 1 tablet by mouth every morning.    [provider]  omega-3 acid ethyl esters (LOVAZA) 1 G capsule Take 1 capsule (1 g total) by mouth 2 (two) times daily. 03/11/15   Mallory, Thedora Hinders, MD  oxycodone (ROXICODONE) 30 MG immediate release tablet Take 30 mg by mouth every 4  (four) hours as needed for pain.    [provider]  traZODone (DESYREL) 50 MG tablet Take 50 mg by mouth at bedtime. 03/20/14   [provider]    Allergies:  Duloxetine hcl, Oscal 500-200 d-3 [oyster shell calcium-d], and Penicillins  Review of Systems: Negative except per HPI.  Physical Exam: Alert and oriented, NAD Head and neck: no masses, normal alignment CV: pulse intact Pulm: no increased work of breathing, respirations even and unlabored Abdomen: non-distended Extremities: extremities warm and well perfused  LABS: No results found for this or any previous visit (from the past 2160 hour(s)).   Complete History and Physical exam available in the office notes  [name]

## 2021-08-30 ENCOUNTER — Encounter (HOSPITAL_BASED_OUTPATIENT_CLINIC_OR_DEPARTMENT_OTHER): Payer: Self-pay | Admitting: Orthopedic Surgery

## 2021-08-30 ENCOUNTER — Other Ambulatory Visit: Payer: Self-pay

## 2021-08-30 NOTE — Progress Notes (Signed)
Spoke w/ via phone for pre-op interview--pt Lab needs dos---- [employer] and ekg (per anes)/ pre-op orders pending            Lab results------ no COVID test -----patient states asymptomatic no test needed Arrive at ------- 1030 on 09-01-2021 NPO after MN NO Solid Food.  Clear liquids from MN until--- 0930 Med rec completed Medications to take morning of surgery ----- Protonix, Atenolol, Effexor, busbar, Subutex Diabetic medication ----- n/a Patient instructed no nail polish to be worn day of surgery Patient instructed to bring photo id and insurance card day of surgery Patient aware to have Driver (ride ) / caregiver  for 24 hours after surgery --husband, jeff Patient Special Instructions ----- n/a Pre-Op special Istructions ----- n/a Patient verbalized understanding of instructions that were given at this phone interview. Patient denies shortness of breath, chest pain, fever, cough at this phone interview.    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 Marnette Burgess (lov 08-17-2021 epic)

## 2021-09-08 ENCOUNTER — Other Ambulatory Visit: Payer: Self-pay

## 2021-09-08 ENCOUNTER — Encounter (HOSPITAL_BASED_OUTPATIENT_CLINIC_OR_DEPARTMENT_OTHER): Payer: Self-pay | Admitting: Orthopedic Surgery

## 2021-09-08 NOTE — Progress Notes (Signed)
Spoke w/ via phone for pre-op interview--pt Lab needs dos---- [employer] and ekg (per anes)/ pre-op orders pending            Lab results------ no COVID test -----patient states asymptomatic no test needed Arrive at ------- 0530 on 09-15-2021 NPO after MN NO Solid Food.  Clear liquids from MN until--- 0430 Med rec completed Medications to take morning of surgery ----- Protonix, Atenolol, Effexor, busbar, Subutex Diabetic medication ----- n/a Patient instructed no nail polish to be worn day of surgery Patient instructed to bring photo id and insurance card day of surgery Patient aware to have Driver (ride ) / caregiver  for 24 hours after surgery --husband, Annette Stewart Patient Special Instructions ----- n/a Pre-Op special Istructions ----- n/a Patient verbalized understanding of instructions that were given at this phone interview. Patient denies shortness of breath, chest pain, fever, cough at this phone interview.    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 Marnette Burgess (lov 08-17-2021 epic)

## 2021-09-14 NOTE — Anesthesia Preprocedure Evaluation (Addendum)
Anesthesia Evaluation  Patient identified by MRN, date of birth, ID band Patient awake    Reviewed: Allergy & Precautions, NPO status , Patient's Chart, lab work & pertinent test results  Airway Mallampati: II  TM Distance: >3 FB Neck ROM: Full    Dental no notable dental hx. (+) Teeth Intact, Dental Advisory Given   Pulmonary sleep apnea , former smoker,    Pulmonary exam normal breath sounds clear to auscultation       Cardiovascular hypertension, Normal cardiovascular exam Rhythm:Regular Rate:Normal     Neuro/Psych  Neuromuscular disease    GI/Hepatic GERD  ,  Endo/Other  Hypothyroidism   Renal/GU      Musculoskeletal   Abdominal   Peds  Hematology   Anesthesia Other Findings   Reproductive/Obstetrics                            Anesthesia Physical Anesthesia Plan  ASA: 2  Anesthesia Plan: MAC   Post-op Pain Management:    Induction: Intravenous  PONV Risk Score and Plan: 3 and Treatment may vary due to age or medical condition and Midazolam  Airway Management Planned: Natural Airway and Nasal Cannula  Additional Equipment: None  Intra-op Plan:   Post-operative Plan:   Informed Consent: I have reviewed the patients History and Physical, chart, labs and discussed the procedure including the risks, benefits and alternatives for the proposed anesthesia with the patient or authorized representative who has indicated his/her understanding and acceptance.     Dental advisory given  Plan Discussed with:   Anesthesia Plan Comments: (MAC)       Anesthesia Quick Evaluation

## 2021-09-15 ENCOUNTER — Other Ambulatory Visit: Payer: Self-pay

## 2021-09-15 ENCOUNTER — Encounter (HOSPITAL_BASED_OUTPATIENT_CLINIC_OR_DEPARTMENT_OTHER)
Admission: RE | Disposition: A | Discharge status: Home / Self Care | Payer: Self-pay | Source: Home / Self Care | Attending: Orthopedic Surgery

## 2021-09-15 ENCOUNTER — Ambulatory Visit (HOSPITAL_BASED_OUTPATIENT_CLINIC_OR_DEPARTMENT_OTHER): Payer: Medicare HMO | Admitting: Anesthesiology

## 2021-09-15 ENCOUNTER — Encounter (HOSPITAL_BASED_OUTPATIENT_CLINIC_OR_DEPARTMENT_OTHER): Payer: Self-pay | Admitting: Orthopedic Surgery

## 2021-09-15 ENCOUNTER — Ambulatory Visit (HOSPITAL_BASED_OUTPATIENT_CLINIC_OR_DEPARTMENT_OTHER)
Admission: RE | Admit: 2021-09-15 | Discharge: 2021-09-15 | Disposition: A | Discharge status: Home / Self Care | Payer: Medicare HMO | Attending: Orthopedic Surgery | Admitting: Orthopedic Surgery

## 2021-09-15 DIAGNOSIS — M65341 Trigger finger, right ring finger: Secondary | ICD-10-CM | POA: Diagnosis present

## 2021-09-15 DIAGNOSIS — Z888 Allergy status to other drugs, medicaments and biological substances status: Secondary | ICD-10-CM | POA: Insufficient documentation

## 2021-09-15 DIAGNOSIS — Z880 Allergy status to penicillin: Secondary | ICD-10-CM | POA: Insufficient documentation

## 2021-09-15 DIAGNOSIS — Z7982 Long term (current) use of aspirin: Secondary | ICD-10-CM | POA: Diagnosis not present

## 2021-09-15 DIAGNOSIS — Z79891 Long term (current) use of opiate analgesic: Secondary | ICD-10-CM | POA: Insufficient documentation

## 2021-09-15 DIAGNOSIS — Z87891 Personal history of nicotine dependence: Secondary | ICD-10-CM | POA: Diagnosis not present

## 2021-09-15 DIAGNOSIS — Z79899 Other long term (current) drug therapy: Secondary | ICD-10-CM | POA: Diagnosis not present

## 2021-09-15 HISTORY — DX: Generalized anxiety disorder: F41.1

## 2021-09-15 HISTORY — DX: Hypothyroidism, unspecified: E03.9

## 2021-09-15 HISTORY — PX: TRIGGER FINGER RELEASE: SHX641

## 2021-09-15 HISTORY — DX: Unspecified urinary incontinence: R32

## 2021-09-15 HISTORY — DX: Chronic pain syndrome: G89.4

## 2021-09-15 HISTORY — DX: Bipolar II disorder: F31.81

## 2021-09-15 HISTORY — DX: Collagenous colitis: K52.831

## 2021-09-15 HISTORY — DX: Narcolepsy without cataplexy: G47.419

## 2021-09-15 HISTORY — DX: Insomnia due to other mental disorder: F51.05

## 2021-09-15 HISTORY — DX: Essential (primary) hypertension: I10

## 2021-09-15 HISTORY — DX: Presence of spectacles and contact lenses: Z97.3

## 2021-09-15 HISTORY — DX: Agoraphobia with panic disorder: F40.01

## 2021-09-15 HISTORY — DX: Irritable bowel syndrome with diarrhea: K58.0

## 2021-09-15 LAB — POCT I-STAT, CHEM 8
BUN: 14 mg/dL (ref 8–23)
Calcium, Ion: 1.31 mmol/L (ref 1.15–1.40)
Chloride: 101 mmol/L (ref 98–111)
Creatinine, Ser: 0.8 mg/dL (ref 0.44–1.00)
Glucose, Bld: 155 mg/dL — ABNORMAL HIGH (ref 70–99)
HCT: 33 % — ABNORMAL LOW (ref 36.0–46.0)
Hemoglobin: 11.2 g/dL — ABNORMAL LOW (ref 12.0–15.0)
Potassium: 3.4 mmol/L — ABNORMAL LOW (ref 3.5–5.1)
Sodium: 141 mmol/L (ref 135–145)
TCO2: 28 mmol/L (ref 22–32)

## 2021-09-15 SURGERY — RELEASE, A1 PULLEY, FOR TRIGGER FINGER
Anesthesia: Monitor Anesthesia Care | Site: Hand | Laterality: Right

## 2021-09-15 MED ORDER — ONDANSETRON HCL 4 MG/2ML IJ SOLN
INTRAMUSCULAR | Status: AC
  Filled 2021-09-15: qty 2

## 2021-09-15 MED ORDER — MIDAZOLAM HCL 2 MG/2ML IJ SOLN
INTRAMUSCULAR | Status: AC
  Filled 2021-09-15: qty 2

## 2021-09-15 MED ORDER — LIDOCAINE HCL (PF) 1 % IJ SOLN
INTRAMUSCULAR | Status: DC | PRN
  Administered 2021-09-15: 4 mL

## 2021-09-15 MED ORDER — FENTANYL CITRATE (PF) 100 MCG/2ML IJ SOLN: 25.0000 ug | INTRAMUSCULAR | Status: DC | PRN

## 2021-09-15 MED ORDER — PROPOFOL 500 MG/50ML IV EMUL
INTRAVENOUS | Status: AC
  Filled 2021-09-15: qty 50

## 2021-09-15 MED ORDER — ONDANSETRON HCL 4 MG/2ML IJ SOLN: 4.0000 mg | Freq: Once | INTRAMUSCULAR | Status: DC | PRN

## 2021-09-15 MED ORDER — PROPOFOL 10 MG/ML IV BOLUS
INTRAVENOUS | Status: DC | PRN
  Administered 2021-09-15: 20 mg via INTRAVENOUS

## 2021-09-15 MED ORDER — CEFAZOLIN SODIUM-DEXTROSE 2-4 GM/100ML-% IV SOLN
2.0000 g | INTRAVENOUS | Status: AC
  Administered 2021-09-15: 2 g via INTRAVENOUS

## 2021-09-15 MED ORDER — BUPIVACAINE HCL (PF) 0.25 % IJ SOLN
INTRAMUSCULAR | Status: DC | PRN
  Administered 2021-09-15: 4 mL

## 2021-09-15 MED ORDER — FENTANYL CITRATE (PF) 100 MCG/2ML IJ SOLN
INTRAMUSCULAR | Status: DC | PRN
  Administered 2021-09-15 (×2): 25 ug via INTRAVENOUS

## 2021-09-15 MED ORDER — AMISULPRIDE (ANTIEMETIC) 5 MG/2ML IV SOLN: 10.0000 mg | Freq: Once | INTRAVENOUS | Status: DC | PRN

## 2021-09-15 MED ORDER — 0.9 % SODIUM CHLORIDE (POUR BTL) OPTIME
TOPICAL | Status: DC | PRN
  Administered 2021-09-15: 500 mL

## 2021-09-15 MED ORDER — ONDANSETRON HCL 4 MG/2ML IJ SOLN
INTRAMUSCULAR | Status: DC | PRN
  Administered 2021-09-15: 4 mg via INTRAVENOUS

## 2021-09-15 MED ORDER — MIDAZOLAM HCL 5 MG/5ML IJ SOLN
INTRAMUSCULAR | Status: DC | PRN
  Administered 2021-09-15 (×2): 1 mg via INTRAVENOUS

## 2021-09-15 MED ORDER — ACETAMINOPHEN 10 MG/ML IV SOLN: 1000.0000 mg | Freq: Once | INTRAVENOUS | Status: DC | PRN

## 2021-09-15 MED ORDER — CEFAZOLIN SODIUM-DEXTROSE 2-4 GM/100ML-% IV SOLN: 2.0000 g | INTRAVENOUS | Status: DC

## 2021-09-15 MED ORDER — LACTATED RINGERS IV SOLN
INTRAVENOUS | Status: DC
  Administered 2021-09-15: 1000 mL via INTRAVENOUS

## 2021-09-15 MED ORDER — OXYCODONE HCL 5 MG/5ML PO SOLN: 5.0000 mg | Freq: Once | ORAL | Status: DC | PRN

## 2021-09-15 MED ORDER — FENTANYL CITRATE (PF) 100 MCG/2ML IJ SOLN
INTRAMUSCULAR | Status: AC
  Filled 2021-09-15: qty 2

## 2021-09-15 MED ORDER — OXYCODONE HCL 5 MG PO TABS: 5.0000 mg | ORAL_TABLET | Freq: Once | ORAL | Status: DC | PRN

## 2021-09-15 MED ORDER — CEFAZOLIN SODIUM-DEXTROSE 2-4 GM/100ML-% IV SOLN
INTRAVENOUS | Status: AC
  Filled 2021-09-15: qty 100

## 2021-09-15 MED ORDER — LIDOCAINE HCL (PF) 2 % IJ SOLN
INTRAMUSCULAR | Status: AC
  Filled 2021-09-15: qty 5

## 2021-09-15 SURGICAL SUPPLY — 36 items
BLADE SURG 15 STRL LF DISP TIS (BLADE) ×1 IMPLANT
BLADE SURG 15 STRL SS (BLADE) ×2
BNDG COHESIVE 1X5 TAN STRL LF (GAUZE/BANDAGES/DRESSINGS) ×2 IMPLANT
BNDG CONFORM 2 STRL LF (GAUZE/BANDAGES/DRESSINGS) ×2 IMPLANT
BNDG ELASTIC 3X5.8 VLCR STR LF (GAUZE/BANDAGES/DRESSINGS) IMPLANT
BNDG ELASTIC 4X5.8 VLCR STR LF (GAUZE/BANDAGES/DRESSINGS) ×2 IMPLANT
BNDG ESMARK 4X9 LF (GAUZE/BANDAGES/DRESSINGS) IMPLANT
CORD BIPOLAR FORCEPS 12FT (ELECTRODE) ×2 IMPLANT
COVER BACK TABLE 60X90IN (DRAPES) ×2 IMPLANT
CUFF TOURN SGL QUICK 18X4 (TOURNIQUET CUFF) IMPLANT
CUFF TOURN SGL QUICK 24 (TOURNIQUET CUFF)
CUFF TRNQT CYL 24X4X16.5-23 (TOURNIQUET CUFF) IMPLANT
DRAPE EXTREMITY T 121X128X90 (DISPOSABLE) ×2 IMPLANT
DRAPE SURG 17X23 STRL (DRAPES) ×2 IMPLANT
GAUZE 4X4 16PLY ~~LOC~~+RFID DBL (SPONGE) ×2 IMPLANT
GAUZE SPONGE 4X4 12PLY STRL (GAUZE/BANDAGES/DRESSINGS) ×2 IMPLANT
GAUZE SPONGE 4X4 12PLY STRL LF (GAUZE/BANDAGES/DRESSINGS) ×2 IMPLANT
GAUZE XEROFORM 1X8 LF (GAUZE/BANDAGES/DRESSINGS) ×2 IMPLANT
GLOVE SURG ENC MOIS LTX SZ7.5 (GLOVE) ×2 IMPLANT
GOWN STRL REUS W/ TWL LRG LVL3 (GOWN DISPOSABLE) ×1 IMPLANT
GOWN STRL REUS W/TWL LRG LVL3 (GOWN DISPOSABLE) ×2
HIBICLENS CHG 4% 4OZ (MISCELLANEOUS) ×2 IMPLANT
NEEDLE HYPO 25X1 1.5 SAFETY (NEEDLE) IMPLANT
NS IRRIG 1000ML POUR BTL (IV SOLUTION) ×2 IMPLANT
PACK BASIN DAY SURGERY FS (CUSTOM PROCEDURE TRAY) ×2 IMPLANT
PAD CAST 3X4 CTTN HI CHSV (CAST SUPPLIES) ×1 IMPLANT
PADDING CAST ABS 4INX4YD NS (CAST SUPPLIES) ×1
PADDING CAST ABS COTTON 4X4 ST (CAST SUPPLIES) ×1 IMPLANT
PADDING CAST COTTON 3X4 STRL (CAST SUPPLIES) ×2
SLEEVE SCD COMPRESS KNEE MED (STOCKING) IMPLANT
STOCKINETTE 4X48 STRL (DRAPES) ×2 IMPLANT
SUT ETHILON 3 0 PS 1 (SUTURE) IMPLANT
SYR BULB EAR ULCER 3OZ GRN STR (SYRINGE) ×2 IMPLANT
SYR CONTROL 10ML LL (SYRINGE) IMPLANT
TOWEL OR 17X26 10 PK STRL BLUE (TOWEL DISPOSABLE) ×2 IMPLANT
UNDERPAD 30X36 HEAVY ABSORB (UNDERPADS AND DIAPERS) ×2 IMPLANT

## 2021-09-15 NOTE — Anesthesia Procedure Notes (Signed)
Procedure Name: MAC Date/Time: 09/15/2021 7:26 AM Performed by: Justice Rocher, CRNA Pre-anesthesia Checklist: Timeout performed, Patient being monitored, Suction available, Emergency Drugs available and Patient identified Patient Re-evaluated:Patient Re-evaluated prior to induction Oxygen Delivery Method: Nasal cannula Preoxygenation: Pre-oxygenation with 100% oxygen Induction Type: IV induction Placement Confirmation: positive ETCO2, CO2 detector and breath sounds checked- equal and bilateral

## 2021-09-15 NOTE — Transfer of Care (Signed)
Immediate Anesthesia Transfer of Care Note  Patient: Annette Stewart  Procedure(s) Performed: Procedure(s) (LRB): RELEASE TRIGGER FINGER/A-1 PULLEY, right ring finger (Right)  Patient Location: PACU  Anesthesia Type: General  Level of Consciousness: awake, sedated, patient cooperative and responds to stimulation  Airway & Oxygen Therapy: Patient Spontanous Breathing and Patient connected to RA with soft FM   Post-op Assessment: Report given to PACU RN, Post -op Vital signs reviewed and stable and Patient moving all extremities  Post vital signs: Reviewed and stable  Complications: No apparent anesthesia complications

## 2021-09-15 NOTE — Interval H&P Note (Signed)
History and Physical Interval Note:  09/15/2021 7:08 AM  Annette Stewart  has presented today for surgery, with the diagnosis of Right finger trigger finger.  The various methods of treatment have been discussed with the patient and family. After consideration of risks, benefits and other options for treatment, the patient has consented to  Procedure(s) with comments: RELEASE TRIGGER FINGER/A-1 PULLEY, right ring finger (Right) - with LOCAL as a surgical intervention.  The patient's history has been reviewed, patient examined, no change in status, stable for surgery.  I have reviewed the patient's chart and labs.  Questions were answered to the patient's satisfaction.     [name]

## 2021-09-15 NOTE — Op Note (Signed)
OPERATIVE NOTE  DATE OF PROCEDURE: 09/15/2021  SURGEONS:  Primary: Orene Desanctis, MD  PREOPERATIVE DIAGNOSIS: Right ring finger trigger finger  POSTOPERATIVE DIAGNOSIS: Same  NAME OF PROCEDURE: Right ring trigger finger release  ANESTHESIA: Monitor Anesthesia Care  SKIN PREPARATION: Hibiclens  ESTIMATED BLOOD LOSS: Minimal  IMPLANTS: none  INDICATIONS:  Annette Stewart is a 65 y.o. female who has the above preoperative diagnosis. The patient has decided to proceed with surgical intervention.  Risks, benefits and alternatives of operative management were discussed including, but not limited to, risks of anesthesia complications, infection, pain, persistent symptoms, stiffness, need for future surgery.  The patient understands, agrees and elects to proceed with surgery.    DESCRIPTION OF PROCEDURE: The patient was met in the pre-operative area and their identity was verified.  The operative location and laterality was also verified and marked.  The patient was brought to the OR and was placed supine on the table.  After repeat patient identification with the operative team anesthesia was provided and the patient was prepped and draped in the usual sterile fashion.  A final timeout was performed verifying the correction patient, procedure, location and laterality.  Local anesthesia was provided via digital block to the right ring finger. The right ring finger was anesthetized. A transverse incision was made over the A1 pulley. Digital nerves were protected. The A1 pulley was released. The tendons were no longer locking or catching with active ROM. The wound was irrigated with saline and 2 horizontal 4-0 mattress nylon sutures were placed. A sterile soft bandage was applied. The patient was awoken from anesthesia and brought to PACU in stable condition. All counts were correct.    Annette Holmes, MD

## 2021-09-15 NOTE — Anesthesia Postprocedure Evaluation (Signed)
Anesthesia Post Note  Patient: Annette Stewart  Procedure(s) Performed: RELEASE TRIGGER FINGER/A-1 PULLEY, right ring finger (Right: Hand)     Patient location during evaluation: PACU Anesthesia Type: MAC and Regional Level of consciousness: awake and alert Pain management: pain level controlled Vital Signs Assessment: post-procedure vital signs reviewed and stable Respiratory status: spontaneous breathing, nonlabored ventilation, respiratory function stable and patient connected to nasal cannula oxygen Cardiovascular status: stable and blood pressure returned to baseline Postop Assessment: no apparent nausea or vomiting Anesthetic complications: no   No notable events documented.  Last Vitals:  Vitals:   09/15/21 0815 09/15/21 0849  BP: 117/64 127/66  Pulse: (!) 46 (!) 46  Resp: 11 14  Temp:  (!) 36.3 C  SpO2: 94% 99%    Last Pain:  Vitals:   09/15/21 0753  TempSrc:   PainSc: 0-No pain                 Barnet Glasgow

## 2021-09-15 NOTE — Discharge Instructions (Addendum)
Orthopaedic Hand Surgery Discharge Instructions  WEIGHT BEARING STATUS: Non weight bearing on operative extremity  INCISION CARE: Keep dressing over your incision clean and dry until 5 days after surgery. You may shower by placing a waterproof covering over your dressing. Once dressing is removed, you may allow water to run over the incision and then place Band-Aids over incision. Do not scrub your incision or apply creams/lotions. Do not submerge your incision or swim for 3 weeks after surgery. Contact your surgeon or primary care doctor if you develop redness or drainage from your incision.   PAIN CONTROL: First line medications for post operative pain control are Tylenol (acetaminophen) and Motrin (ibuprofen) if you are able to take these medications. If you have been prescribed a medication these can be taken as breakthrough pain medications. Please note that some narcotic pain medication have acetaminophen added and you should never consume more than 4,000mg  of acetaminophen in 24 hour period. Also please note that if you are given Toradol (ketoralac) you should not take similar medications simultaneously such as ibuprofen.   ICE/ELEVATION: Ice and elevate your injured extremity as needed. Avoid direct contact of ice with skin.  HOME MEDICATIONS: No changes have been made to your home medications.  FOLLOW UP: You will be called after surgery with an appointment date and time, however if you have not received a phone call within 3 days please call during regular office hours at [phone] to schedule a post operative appointment.  Please Seek Medical Attention if: Call MD for: pain or pressure in chest, jaw, arm, back, neck  Call MD for: temperature greater than 101 F for more than 24 hours  Call MD for: difficulty breathing Call MD for: Incision redness, bleeding, drainage  Call MD for: palpitations or feeling that the heart is racing  Call MD for: increased swelling in arm, leg, ankle, or  abdomen  Call MD for: lightheadedness, dizziness, fainting Go to ED or call 911 if: chest pain does not go away after 3 nitroglycerin doses taken 5 min apart  Go to ED or call 911 for: any uncontrolled bleeding  Go to ED or call 911 if: unable to reach physician  Discharge Medications: Sent through [facility] to pharmacy.    Philipp Ovens, MD Orthopaedic Hand Surgery   Post Anesthesia Home Care Instructions  Activity: Get plenty of rest for the remainder of the day. A responsible individual must stay with you for 24 hours following the procedure.  For the next 24 hours, DO NOT: -Drive a car -Advertising copywriter -Drink alcoholic beverages -Take any medication unless instructed by your physician -Make any legal decisions or sign important papers.  Meals: Start with liquid foods such as gelatin or soup. Progress to regular foods as tolerated. Avoid greasy, spicy, heavy foods. If nausea and/or vomiting occur, drink only clear liquids until the nausea and/or vomiting subsides. Call your physician if vomiting continues.  Special Instructions/Symptoms: Your throat may feel dry or sore from the anesthesia or the breathing tube placed in your throat during surgery. If this causes discomfort, gargle with warm salt water. The discomfort should disappear within 24 hours.

## 2021-09-16 ENCOUNTER — Encounter (HOSPITAL_BASED_OUTPATIENT_CLINIC_OR_DEPARTMENT_OTHER): Payer: Self-pay | Admitting: Orthopedic Surgery
